# Patient Record
Sex: Male | Born: 1965 | Race: Black or African American | Hispanic: No | Marital: Married | Smoking: Never smoker
Health system: Southern US, Community
[De-identification: ages and names within clinical notes are randomized; demographics above are authoritative.]

## PROBLEM LIST (undated history)

## (undated) DIAGNOSIS — G4730 Sleep apnea, unspecified: Secondary | ICD-10-CM

## (undated) DIAGNOSIS — I10 Essential (primary) hypertension: Secondary | ICD-10-CM

## (undated) HISTORY — PX: NO PAST SURGERIES: SHX2092

---

## 2002-04-28 ENCOUNTER — Encounter: Payer: Self-pay | Admitting: Emergency Medicine

## 2002-04-28 ENCOUNTER — Emergency Department (HOSPITAL_COMMUNITY): Admission: EM | Admit: 2002-04-28 | Discharge: 2002-04-28 | Payer: Self-pay | Admitting: Emergency Medicine

## 2004-02-03 ENCOUNTER — Emergency Department (HOSPITAL_COMMUNITY): Admission: EM | Admit: 2004-02-03 | Discharge: 2004-02-03 | Payer: Self-pay | Admitting: Emergency Medicine

## 2006-04-16 ENCOUNTER — Encounter: Admission: RE | Admit: 2006-04-16 | Discharge: 2006-04-16 | Payer: Self-pay | Admitting: Internal Medicine

## 2008-08-08 ENCOUNTER — Encounter: Admission: RE | Admit: 2008-08-08 | Discharge: 2008-08-08 | Payer: Self-pay | Admitting: Urology

## 2009-01-19 ENCOUNTER — Encounter: Admission: RE | Admit: 2009-01-19 | Discharge: 2009-01-19 | Payer: Self-pay | Admitting: Orthopedic Surgery

## 2014-07-14 ENCOUNTER — Ambulatory Visit
Admission: RE | Admit: 2014-07-14 | Discharge: 2014-07-14 | Disposition: A | Discharge status: Home / Self Care | Payer: BC Managed Care – PPO | Source: Ambulatory Visit | Attending: Internal Medicine | Admitting: Internal Medicine

## 2014-07-14 ENCOUNTER — Other Ambulatory Visit: Payer: Self-pay | Admitting: Internal Medicine

## 2014-07-14 DIAGNOSIS — R1012 Left upper quadrant pain: Secondary | ICD-10-CM

## 2014-12-30 ENCOUNTER — Ambulatory Visit
Admission: RE | Admit: 2014-12-30 | Discharge: 2014-12-30 | Disposition: A | Discharge status: Home / Self Care | Payer: BC Managed Care – PPO | Source: Ambulatory Visit | Attending: Internal Medicine | Admitting: Internal Medicine

## 2014-12-30 ENCOUNTER — Other Ambulatory Visit: Payer: Self-pay | Admitting: Internal Medicine

## 2014-12-30 DIAGNOSIS — M545 Low back pain: Secondary | ICD-10-CM

## 2014-12-30 DIAGNOSIS — M546 Pain in thoracic spine: Secondary | ICD-10-CM

## 2015-11-03 ENCOUNTER — Encounter (HOSPITAL_COMMUNITY): Payer: Self-pay | Admitting: Family Medicine

## 2015-11-03 ENCOUNTER — Emergency Department (HOSPITAL_COMMUNITY)
Admission: EM | Admit: 2015-11-03 | Discharge: 2015-11-03 | Disposition: A | Discharge status: Home / Self Care | Payer: BC Managed Care – PPO | Attending: Emergency Medicine | Admitting: Emergency Medicine

## 2015-11-03 DIAGNOSIS — R51 Headache: Secondary | ICD-10-CM | POA: Diagnosis present

## 2015-11-03 DIAGNOSIS — R200 Anesthesia of skin: Secondary | ICD-10-CM | POA: Insufficient documentation

## 2015-11-03 DIAGNOSIS — R519 Headache, unspecified: Secondary | ICD-10-CM

## 2015-11-03 DIAGNOSIS — I10 Essential (primary) hypertension: Secondary | ICD-10-CM | POA: Insufficient documentation

## 2015-11-03 HISTORY — DX: Essential (primary) hypertension: I10

## 2015-11-03 MED ORDER — KETOROLAC TROMETHAMINE 60 MG/2ML IM SOLN
60.0000 mg | Freq: Once | INTRAMUSCULAR | Status: AC
  Administered 2015-11-03: 60 mg via INTRAMUSCULAR
  Filled 2015-11-03: qty 2

## 2015-11-03 NOTE — ED Notes (Signed)
Pt here for increased BP and headaches. sts some intermittent tingling in mouth. sts hard to swallow. sts he hasn't been taking meds everyday. sts he took lisinopril this am and Wednesday. sts took tylenol for headache.

## 2015-11-03 NOTE — Discharge Instructions (Signed)
Return to the ED with any concerns including chest pain, difficulty breathing, weakness of arms or legs, changes in vision or speech, fainting, decreased level of alertness/lethargy, or any other alarming symptoms °

## 2015-11-03 NOTE — ED Notes (Signed)
Pt states he started having bilateral jaw pain and left arm pain earlier today. Pt now c/o headache.

## 2015-11-03 NOTE — ED Provider Notes (Signed)
CSN: [id number]     Arrival date & time 11/03/15  1200 History   First MD Initiated Contact with Patient 11/03/15 1708     Chief Complaint  Patient presents with  . Hypertension     (Consider location/radiation/quality/duration/timing/severity/associated sxs/prior Treatment) HPI  Pt with hx of hypertension presenting with concern for elevated blood pressure as well as headache.  Pt states he does miss some BP med doses but over the past 2 days his pressure has been running higher than usual.  Also c/o frontal headache.  No changes in vision or speech.  No focal weakness.  Had some numbness around his mouth on both sides as well. No chest pain, no shortness of breath.  No difficulty breathing or leg swelling.  There are no other associated systemic symptoms, there are no other alleviating or modifying factors.   Past Medical History  Diagnosis Date  . Hypertension    History reviewed. No pertinent past surgical history. History reviewed. No pertinent family history. Social History  Substance Use Topics  . Smoking status: Never Smoker   . Smokeless tobacco: None  . Alcohol Use: No    Review of Systems  ROS reviewed and all otherwise negative except for mentioned in HPI    Allergies  Review of patient's allergies indicates no known allergies.  Home Medications   Prior to Admission medications   Not on File   BP 109/56 mmHg  Pulse 75  Temp(Src) 98.7 F (37.1 C) (Oral)  Resp 16  Ht 5\' 11"  (1.803 m)  Wt 130.636 kg  BMI 40.19 kg/m2  SpO2 99%  Vitals reviewed Physical Exam  Physical Examination: General appearance - alert, well appearing, and in no distress Mental status - alert, oriented to person, place, and time Eyes - pupils equal and reactive, extraocular eye movements intact Mouth - mucous membranes moist, pharynx normal without lesions Chest - clear to auscultation, no wheezes, rales or rhonchi, symmetric air entry Heart - normal rate, regular rhythm, normal S1,  S2, no murmurs, rubs, clicks or gallops Abdomen - soft, nontender, nondistended, no masses or organomegaly Neurological - alert, orientedx 3, cranial nerves 2-12 tested and intact, strength 5/5 in extremities x 4, sensation intact Extremities - peripheral pulses normal, no pedal edema, no clubbing or cyanosis Skin - normal coloration and turgor, no rashes  ED Course  Procedures (including critical care time) Labs Review Labs Reviewed - No data to display  Imaging Review No results found. I have personally reviewed and evaluated these images and lab results as part of my medical decision-making.   EKG Interpretation None       Date: 11/03/2015  Rate: 68  Rhythm: normal sinus rhythm  QRS Axis: normal  Intervals: normal  ST/T Wave abnormalities: early repolarization  Conduction Disutrbances: left anterior fascicular block  Narrative Interpretation: no prior ekg for comparison ekg link not available in epic for interpretation into muse     MDM   Final diagnoses:  Essential hypertension  Nonintractable episodic headache, unspecified headache type    Pt presenting with concern for hypertension as well as headache.  At the time of my evaluation, BP was normal.  Pt took his BP meds prior to arrival.  Pt has frontal headache- feels improved with rubbing his temples.  Normal neuro exam.  No signs of acute stroke, tumor.  No fever or neck pain to suggest meningitis.  Suspect tension type headache.  He feels some improvement after toradol.  Discharged with strict return precautions.  Pt agreeable with plan.    Jerelyn Scott, MD 11/03/15 [phone]

## 2015-11-03 NOTE — ED Notes (Signed)
Patient states symptoms started Wednesday night.

## 2018-07-07 ENCOUNTER — Other Ambulatory Visit: Payer: Self-pay | Admitting: Internal Medicine

## 2018-07-07 DIAGNOSIS — R1030 Lower abdominal pain, unspecified: Secondary | ICD-10-CM

## 2018-07-09 ENCOUNTER — Ambulatory Visit
Admission: RE | Admit: 2018-07-09 | Discharge: 2018-07-09 | Disposition: A | Discharge status: Home / Self Care | Payer: BC Managed Care – PPO | Source: Ambulatory Visit | Attending: Internal Medicine | Admitting: Internal Medicine

## 2018-07-09 ENCOUNTER — Other Ambulatory Visit: Payer: BC Managed Care – PPO

## 2018-07-09 DIAGNOSIS — R1030 Lower abdominal pain, unspecified: Secondary | ICD-10-CM

## 2018-07-09 MED ORDER — IOPAMIDOL (ISOVUE-300) INJECTION 61%
125.0000 mL | Freq: Once | INTRAVENOUS | Status: AC | PRN
  Administered 2018-07-09: 125 mL via INTRAVENOUS

## 2019-09-23 ENCOUNTER — Ambulatory Visit: Payer: BC Managed Care – PPO | Attending: Family

## 2019-09-23 DIAGNOSIS — Z23 Encounter for immunization: Secondary | ICD-10-CM

## 2019-09-23 NOTE — Progress Notes (Signed)
   Covid-19 Vaccination Clinic  Name:  Kevin [name]    MRN: [id number] DOB: [birth year]  09/23/2019  Kevin [name] was observed post Covid-19 immunization for 15 minutes without incident. He was provided with Vaccine Information Sheet and instruction to access the V-Safe system.   Kevin [name] was instructed to call 911 with any severe reactions post vaccine: Marland Kitchen Difficulty breathing  . Swelling of face and throat  . A fast heartbeat  . A bad rash all over body  . Dizziness and weakness   Immunizations Administered    Name Date Dose VIS Date Route   Moderna COVID-19 Vaccine 09/23/2019 11:03 AM 0.5 mL 06/01/2019 Intramuscular   Manufacturer: Moderna   Lot: [id number]   NDC: 81103-159-45

## 2019-10-26 ENCOUNTER — Ambulatory Visit: Payer: BC Managed Care – PPO | Attending: Family

## 2019-10-26 DIAGNOSIS — Z23 Encounter for immunization: Secondary | ICD-10-CM

## 2019-10-26 NOTE — Progress Notes (Signed)
   Covid-19 Vaccination Clinic  Name:  Kevin [name]    MRN: [id number] DOB: August 20, 1965  10/26/2019  Kevin [name] was observed post Covid-19 immunization for 15 minutes without incident. He was provided with Vaccine Information Sheet and instruction to access the V-Safe system.   Kevin [name] was instructed to call 911 with any severe reactions post vaccine: Marland Kitchen Difficulty breathing  . Swelling of face and throat  . A fast heartbeat  . A bad rash all over body  . Dizziness and weakness   Immunizations Administered    Name Date Dose VIS Date Route   Moderna COVID-19 Vaccine 10/26/2019 11:02 AM 0.5 mL 06/2019 Intramuscular   Manufacturer: Moderna   Lot: [id number]   NDC: 14643-142-76

## 2020-02-02 ENCOUNTER — Encounter (INDEPENDENT_AMBULATORY_CARE_PROVIDER_SITE_OTHER): Payer: Self-pay | Admitting: Otolaryngology

## 2020-02-02 ENCOUNTER — Ambulatory Visit (INDEPENDENT_AMBULATORY_CARE_PROVIDER_SITE_OTHER): Payer: BC Managed Care – PPO | Admitting: Otolaryngology

## 2020-02-02 ENCOUNTER — Other Ambulatory Visit: Payer: Self-pay

## 2020-02-02 VITALS — Temp 97.2°F

## 2020-02-02 DIAGNOSIS — J343 Hypertrophy of nasal turbinates: Secondary | ICD-10-CM

## 2020-02-02 DIAGNOSIS — J310 Chronic rhinitis: Secondary | ICD-10-CM | POA: Diagnosis not present

## 2020-02-02 NOTE — Progress Notes (Signed)
HPI: Kevin [name] is a 54 y.o. male who presents for evaluation of chronic nasal problems.  He is always had trouble breathing through his nose.  He used to use Synex and then Dristan but had some problems with these.  He tried the [employer] which did not seem to help.  Most recently was prescribed Nasacort and he seems to be doing better on the Nasacort.  He describes congestion and pressure in the nose at times.  He also states that his throat feels tight.  Past Medical History:  Diagnosis Date  . Hypertension    No past surgical history on file. Social History   Socioeconomic History  . Marital status: Married    Spouse name: Not on file  . Number of children: Not on file  . Years of education: Not on file  . Highest education level: Not on file  Occupational History  . Not on file  Tobacco Use  . Smoking status: Never Smoker  . Smokeless tobacco: Never Used  Substance and Sexual Activity  . Alcohol use: No  . Drug use: No  . Sexual activity: Not on file  Other Topics Concern  . Not on file  Social History Narrative  . Not on file   Social Determinants of Health   Financial Resource Strain:   . Difficulty of Paying Living Expenses:   Food Insecurity:   . Worried About Programme researcher, broadcasting/film/video in the Last Year:   . Barista in the Last Year:   Transportation Needs:   . Freight forwarder (Medical):   Marland Kitchen Lack of Transportation (Non-Medical):   Physical Activity:   . Days of Exercise per Week:   . Minutes of Exercise per Session:   Stress:   . Feeling of Stress :   Social Connections:   . Frequency of Communication with Friends and Family:   . Frequency of Social Gatherings with Friends and Family:   . Attends Religious Services:   . Active Member of Clubs or Organizations:   . Attends Banker Meetings:   Marland Kitchen Marital Status:    No family history on file. No Known Allergies Prior to Admission medications   Not on File     Positive ROS:  Otherwise negative  All other systems have been reviewed and were otherwise negative with the exception of those mentioned in the HPI and as above.  Physical Exam: Constitutional: Alert, well-appearing, no acute distress Ears: External ears without lesions or tenderness. Ear canals are clear bilaterally with intact, clear TMs.  Nasal: External nose without lesions. Septum mildly deviated to the left with moderate rhinitis.  He has a large swollen turbinates bilaterally.  After decongesting the nose he has no polyps or obstructing lesions noted otherwise.  Both millimeters regions are clear.. Oral: Lips and gums without lesions. Tongue and palate mucosa without lesions. Posterior oropharynx clear. Indirect laryngoscopy revealed a clear base of tongue vallecula epiglottis.  Vocal cords were clear.  Piriform sinuses were clear. Neck: No palpable adenopathy or masses Respiratory: Breathing comfortably  Skin: No facial/neck lesions or rash noted.  Procedures  Assessment: Chronic rhinitis Mild septal deviation with moderate turbinate hypertrophy  Plan: Would recommend regular use of Nasacort 2 sprays each nostril at night as well as intermittent saline irrigation as needed excessive mucus. If he does not get adequate relief with the use of the Nasacort could consider possible surgical intervention. But reviewed with him that his nasal passages are relatively clear  today.  Narda Bonds, MD

## 2020-05-16 ENCOUNTER — Other Ambulatory Visit: Payer: Self-pay | Admitting: Otolaryngology

## 2020-07-04 ENCOUNTER — Encounter (HOSPITAL_COMMUNITY): Payer: Self-pay | Admitting: Physician Assistant

## 2020-07-07 ENCOUNTER — Other Ambulatory Visit: Payer: Self-pay

## 2020-07-07 ENCOUNTER — Encounter (HOSPITAL_BASED_OUTPATIENT_CLINIC_OR_DEPARTMENT_OTHER): Payer: Self-pay | Admitting: Otolaryngology

## 2020-07-07 NOTE — Pre-Procedure Instructions (Signed)
Landan Fedie Vinston IV  07/07/2020     No Pharmacies Listed   Your procedure is scheduled on Jan. 12  Report to [facility] Entrance A at 6:30 A.M.  Call this number if you have problems the morning of surgery:  [phone]   Remember:  Do not eat or drink after midnight.     Take these medicines the morning of surgery with A SIP OF WATER : none              7 days prior to surgery STOP taking any Aspirin (unless otherwise instructed by your surgeon), Aleve, Naproxen, Ibuprofen, Motrin, Advil, Goody's, BC's, all herbal medications, fish oil, and all vitamins.             Follow your surgeon's instructions on when to stop Aspirin.  If no instructions were given by your surgeon then you will need to call the office to get those instructions.      Do not wear jewelry.  Do not wear lotions, powders, or perfumes, or deodorant.  Do not shave 48 hours prior to surgery.  Men may shave face and neck.  Do not bring valuables to the hospital.  [facility] is not responsible for any belongings or valuables.  Contacts, dentures or bridgework may not be worn into surgery.  Leave your suitcase in the car.  After surgery it may be brought to your room.  For patients admitted to the hospital, discharge time will be determined by your treatment team.  Patients discharged the day of surgery will not be allowed to drive home.   Special instructions:  [facility]- Preparing For Surgery  Before surgery, you can play an important role. Because skin is not sterile, your skin needs to be as free of germs as possible. You can reduce the number of germs on your skin by washing with CHG (chlorahexidine gluconate) Soap before surgery.  CHG is an antiseptic cleaner which kills germs and bonds with the skin to continue killing germs even after washing.    Oral Hygiene is also important to reduce your risk of infection.  Remember - BRUSH YOUR TEETH THE MORNING OF SURGERY WITH YOUR REGULAR  TOOTHPASTE  Please do not use if you have an allergy to CHG or antibacterial soaps. If your skin becomes reddened/irritated stop using the CHG.  Do not shave (including legs and underarms) for at least 48 hours prior to first CHG shower. It is OK to shave your face.  Please follow these instructions carefully.   1. Shower the NIGHT BEFORE SURGERY and the MORNING OF SURGERY with CHG.   2. If you chose to wash your hair, wash your hair first as usual with your normal shampoo.  3. After you shampoo, rinse your hair and body thoroughly to remove the shampoo.  4. Use CHG as you would any other liquid soap. You can apply CHG directly to the skin and wash gently with a scrungie or a clean washcloth.   5. Apply the CHG Soap to your body ONLY FROM THE NECK DOWN.  Do not use on open wounds or open sores. Avoid contact with your eyes, ears, mouth and genitals (private parts). Wash Face and genitals (private parts)  with your normal soap.  6. Wash thoroughly, paying special attention to the area where your surgery will be performed.  7. Thoroughly rinse your body with warm water from the neck down.  8. DO NOT shower/wash with your normal soap after using and  rinsing off the CHG Soap.  9. Pat yourself dry with a CLEAN TOWEL.  10. Wear CLEAN PAJAMAS to bed the night before surgery, wear comfortable clothes the morning of surgery  11. Place CLEAN SHEETS on your bed the night of your first shower and DO NOT SLEEP WITH PETS.    Day of Surgery:  Do not apply any deodorants/lotions.  Please wear clean clothes to the hospital/surgery center.   Remember to brush your teeth WITH YOUR REGULAR TOOTHPASTE.    Please read over the following fact sheets that you were given.

## 2020-07-10 ENCOUNTER — Other Ambulatory Visit: Payer: Self-pay

## 2020-07-10 ENCOUNTER — Encounter (HOSPITAL_COMMUNITY)
Admission: RE | Admit: 2020-07-10 | Discharge: 2020-07-10 | Disposition: A | Discharge status: Home / Self Care | Payer: BC Managed Care – PPO | Source: Ambulatory Visit | Attending: Otolaryngology | Admitting: Otolaryngology

## 2020-07-11 ENCOUNTER — Other Ambulatory Visit (HOSPITAL_COMMUNITY): Payer: BC Managed Care – PPO

## 2020-07-11 NOTE — Progress Notes (Signed)
Called and spoke with pt about going for COVID test today for surgery tomorrow 07/12/20. Pt stated his surgery will need to be on hold as he will not be able to pay the amount his insurance has projected for surgery. He stated he has called and LVM at Dr. Clovis Pu office. Left message with Caryn Bee at Dr. Thurmon Fair office.

## 2020-07-12 ENCOUNTER — Ambulatory Visit (HOSPITAL_BASED_OUTPATIENT_CLINIC_OR_DEPARTMENT_OTHER): Admission: RE | Admit: 2020-07-12 | Payer: BC Managed Care – PPO | Source: Home / Self Care | Admitting: Otolaryngology

## 2020-07-12 HISTORY — DX: Essential (primary) hypertension: I10

## 2020-07-12 SURGERY — SEPTOPLASTY, NOSE, WITH NASAL TURBINATE REDUCTION
Anesthesia: General | Laterality: Bilateral

## 2020-11-10 ENCOUNTER — Ambulatory Visit
Admission: RE | Admit: 2020-11-10 | Discharge: 2020-11-10 | Disposition: A | Discharge status: Home / Self Care | Payer: BC Managed Care – PPO | Source: Ambulatory Visit | Attending: Internal Medicine | Admitting: Internal Medicine

## 2020-11-10 ENCOUNTER — Other Ambulatory Visit: Payer: Self-pay | Admitting: Internal Medicine

## 2020-11-10 DIAGNOSIS — M25562 Pain in left knee: Secondary | ICD-10-CM

## 2021-01-30 ENCOUNTER — Ambulatory Visit (HOSPITAL_COMMUNITY)
Admission: EM | Admit: 2021-01-30 | Discharge: 2021-01-30 | Disposition: A | Discharge status: Home / Self Care | Payer: BC Managed Care – PPO

## 2021-01-30 ENCOUNTER — Other Ambulatory Visit: Payer: Self-pay

## 2021-01-30 ENCOUNTER — Encounter (HOSPITAL_COMMUNITY): Payer: Self-pay | Admitting: Emergency Medicine

## 2021-01-30 DIAGNOSIS — J302 Other seasonal allergic rhinitis: Secondary | ICD-10-CM | POA: Diagnosis not present

## 2021-01-30 DIAGNOSIS — J342 Deviated nasal septum: Secondary | ICD-10-CM

## 2021-01-30 NOTE — Discharge Instructions (Addendum)
Continue to use the nasal spray as previously prescribed. Take a daily allergy medication, such as Allegra-D.    You can take Tylenol and/or Ibuprofen as needed for pain and fevers.    Follow up with your primary care provider tomorrow as scheduled.  You may need referral to ENT for further evaluation and management.

## 2021-01-30 NOTE — ED Provider Notes (Signed)
[facility]    CSN: [id number] Arrival date & time: 01/30/21  1556      History   Chief Complaint Chief Complaint  Patient presents with   Nasal Congestion   Shortness of Breath   chest heaviness    HPI Kevin [name] is a 55 y.o. male.   Patient here for evaluation of shortness of breath, chest tightness, and nasal congestion.  Reports diagnosed with a deviated septum previously.  States that he will develop symptoms intermittently for the past year but that they typically resolve on their own.  Reports appointment with PCP tomorrow.  States that earlier today developed congestion and chest tightness.  At this time symptoms have resolved.  Has not taken any OTC medications or treatments.  Denies any trauma, injury, or other precipitating event.  Denies any specific alleviating or aggravating factors.  Denies any fevers, N/V/D, numbness, tingling, weakness, abdominal pain, or headaches.     The history is provided by the patient.  Shortness of Breath Associated symptoms: no chest pain, no cough and no wheezing    Past Medical History:  Diagnosis Date   Hypertension     There are no problems to display for this patient.   History reviewed. No pertinent surgical history.     Home Medications    Prior to Admission medications   Medication Sig Start Date End Date Taking? Authorizing Provider  lisinopril (ZESTRIL) 10 MG tablet Take 10 mg by mouth daily.    [provider]  Omega-3 Fatty Acids (FISH OIL) 1000 MG CAPS 2 capsule    [provider]  tamsulosin (FLOMAX) 0.4 MG CAPS capsule 1 capsule    [provider]  triamcinolone (NASACORT ALLERGY 24HR) 55 MCG/ACT AERO nasal inhaler 2 spray in each nostril    [provider]    Family History History reviewed. No pertinent family history.  Social History Social History   Tobacco Use   Smoking status: Never   Smokeless tobacco: Never  Vaping Use   Vaping Use: Never  used  Substance Use Topics   Alcohol use: Not Currently   Drug use: Never     Allergies   Patient has no known allergies.   Review of Systems Review of Systems  Respiratory:  Positive for chest tightness and shortness of breath. Negative for cough, choking, wheezing and stridor.   Cardiovascular:  Negative for chest pain, palpitations and leg swelling.  All other systems reviewed and are negative.   Physical Exam Triage Vital Signs ED Triage Vitals  Enc Vitals Group     BP 01/30/21 1651 (!) 156/92     Pulse Rate 01/30/21 1651 71     Resp 01/30/21 1651 18     Temp 01/30/21 1651 98.8 F (37.1 C)     Temp src --      SpO2 01/30/21 1651 97 %     Weight --      Height --      Head Circumference --      Peak Flow --      Pain Score 01/30/21 1649 0     Pain Loc --      Pain Edu? --      Excl. in GC? --    No data found.  Updated Vital Signs BP (!) 156/92   Pulse 71   Temp 98.8 F (37.1 C)   Resp 18   SpO2 97%   Visual Acuity Right Eye Distance:   Left  Eye Distance:   Bilateral Distance:    Right Eye Near:   Left Eye Near:    Bilateral Near:     Physical Exam Vitals and nursing note reviewed.  Constitutional:      General: He is not in acute distress.    Appearance: Normal appearance. He is not ill-appearing, toxic-appearing or diaphoretic.  HENT:     Head: Normocephalic and atraumatic.     Nose: Septal deviation (right side partially occluded) present. No nasal tenderness, mucosal edema, congestion or rhinorrhea.     Right Turbinates: Swollen.     Right Sinus: No maxillary sinus tenderness or frontal sinus tenderness.     Left Sinus: No maxillary sinus tenderness or frontal sinus tenderness.  Eyes:     Conjunctiva/sclera: Conjunctivae normal.  Cardiovascular:     Rate and Rhythm: Normal rate.     Pulses: Normal pulses.     Heart sounds: Normal heart sounds.  Pulmonary:     Effort: Pulmonary effort is normal.     Breath sounds: Normal breath sounds.  No decreased breath sounds, wheezing, rhonchi or rales.  Abdominal:     General: Abdomen is flat.  Musculoskeletal:        General: Normal range of motion.     Cervical back: Normal range of motion.  Skin:    General: Skin is warm and dry.  Neurological:     General: No focal deficit present.     Mental Status: He is alert and oriented to person, place, and time.  Psychiatric:        Mood and Affect: Mood normal.     UC Treatments / Results  Labs (all labs ordered are listed, but only abnormal results are displayed) Labs Reviewed - No data to display  EKG   Radiology No results found.  Procedures Procedures (including critical care time)  Medications Ordered in UC Medications - No data to display  Initial Impression / Assessment and Plan / UC Course  I have reviewed the triage vital signs and the nursing notes.  Pertinent labs & imaging results that were available during my care of the patient were reviewed by me and considered in my medical decision making (see chart for details).    Assessment negative for red flags or concerns.  Likely deviated septum and seasonal allergies.  Recommend continue nasal spray as previously prescribed.  Recommend starting daily allergy medication, such as Allegra-D.  Follow up with PCP tomorrow as scheduled.  May need referral to ENT for further evaluation.  Final Clinical Impressions(s) / UC Diagnoses   Final diagnoses:  Deviated septum  Seasonal allergies     Discharge Instructions      Continue to use the nasal spray as previously prescribed. Take a daily allergy medication, such as Allegra-D.    You can take Tylenol and/or Ibuprofen as needed for pain and fevers.    Follow up with your primary care provider tomorrow as scheduled.  You may need referral to ENT for further evaluation and management.       ED Prescriptions   None    PDMP not reviewed this encounter.   Ivette Loyal, NP 01/30/21 1734

## 2021-01-30 NOTE — ED Triage Notes (Signed)
Pt is present today with chest heaviness, SOB, and nasal congestion. Pt states that his sx started today. Pt states that his sx are sporadic and started one year and recently became bothersome today

## 2021-01-31 ENCOUNTER — Other Ambulatory Visit: Payer: Self-pay | Admitting: Internal Medicine

## 2021-01-31 ENCOUNTER — Ambulatory Visit
Admission: RE | Admit: 2021-01-31 | Discharge: 2021-01-31 | Disposition: A | Discharge status: Home / Self Care | Payer: BC Managed Care – PPO | Source: Ambulatory Visit | Attending: Internal Medicine | Admitting: Internal Medicine

## 2021-01-31 DIAGNOSIS — R0789 Other chest pain: Secondary | ICD-10-CM

## 2021-02-18 ENCOUNTER — Encounter (HOSPITAL_COMMUNITY): Payer: Self-pay | Admitting: *Deleted

## 2021-02-18 ENCOUNTER — Other Ambulatory Visit: Payer: Self-pay

## 2021-02-18 ENCOUNTER — Emergency Department (HOSPITAL_COMMUNITY)
Admission: EM | Admit: 2021-02-18 | Discharge: 2021-02-19 | Disposition: A | Discharge status: Home / Self Care | Payer: BC Managed Care – PPO | Attending: Emergency Medicine | Admitting: Emergency Medicine

## 2021-02-18 ENCOUNTER — Emergency Department (HOSPITAL_COMMUNITY): Payer: BC Managed Care – PPO

## 2021-02-18 DIAGNOSIS — I10 Essential (primary) hypertension: Secondary | ICD-10-CM | POA: Insufficient documentation

## 2021-02-18 DIAGNOSIS — Z20822 Contact with and (suspected) exposure to covid-19: Secondary | ICD-10-CM | POA: Insufficient documentation

## 2021-02-18 DIAGNOSIS — K2100 Gastro-esophageal reflux disease with esophagitis, without bleeding: Secondary | ICD-10-CM

## 2021-02-18 DIAGNOSIS — R079 Chest pain, unspecified: Secondary | ICD-10-CM | POA: Diagnosis present

## 2021-02-18 DIAGNOSIS — Z79899 Other long term (current) drug therapy: Secondary | ICD-10-CM | POA: Insufficient documentation

## 2021-02-18 LAB — CBC
HCT: 41.4 % (ref 39.0–52.0)
Hemoglobin: 14.5 g/dL (ref 13.0–17.0)
MCH: 30.2 pg (ref 26.0–34.0)
MCHC: 35 g/dL (ref 30.0–36.0)
MCV: 86.3 fL (ref 80.0–100.0)
Platelets: 302 10*3/uL (ref 150–400)
RBC: 4.8 MIL/uL (ref 4.22–5.81)
RDW: 13.4 % (ref 11.5–15.5)
WBC: 8.4 10*3/uL (ref 4.0–10.5)
nRBC: 0 % (ref 0.0–0.2)

## 2021-02-18 LAB — BASIC METABOLIC PANEL
Anion gap: 9 (ref 5–15)
BUN: 12 mg/dL (ref 6–20)
CO2: 23 mmol/L (ref 22–32)
Calcium: 9.6 mg/dL (ref 8.9–10.3)
Chloride: 105 mmol/L (ref 98–111)
Creatinine, Ser: 0.91 mg/dL (ref 0.61–1.24)
GFR, Estimated: >60 mL/min (ref >60)
Glucose, Bld: 107 mg/dL — ABNORMAL HIGH (ref 70–99)
Potassium: 3.1 mmol/L — ABNORMAL LOW (ref 3.5–5.1)
Sodium: 137 mmol/L (ref 135–145)

## 2021-02-18 LAB — RESP PANEL BY RT-PCR (FLU A&B, COVID) ARPGX2
Influenza A by PCR: NEGATIVE
Influenza B by PCR: NEGATIVE
SARS Coronavirus 2 by RT PCR: NEGATIVE

## 2021-02-18 LAB — TROPONIN I (HIGH SENSITIVITY): Troponin I (High Sensitivity): 7 ng/L (ref <18)

## 2021-02-18 MED ORDER — OMEPRAZOLE 20 MG PO CPDR: 20.0000 mg | DELAYED_RELEASE_CAPSULE | Freq: Every day | ORAL | 0 refills | Status: DC

## 2021-02-18 MED ORDER — ALUM & MAG HYDROXIDE-SIMETH 200-200-20 MG/5ML PO SUSP
30.0000 mL | Freq: Once | ORAL | Status: AC
  Administered 2021-02-18: 30 mL via ORAL
  Filled 2021-02-18: qty 30

## 2021-02-18 MED ORDER — SUCRALFATE 1 GM/10ML PO SUSP: 1.0000 g | Freq: Three times a day (TID) | ORAL | 0 refills | Status: DC

## 2021-02-18 MED ORDER — POTASSIUM CHLORIDE CRYS ER 20 MEQ PO TBCR
40.0000 meq | EXTENDED_RELEASE_TABLET | Freq: Once | ORAL | Status: AC
  Administered 2021-02-18: 40 meq via ORAL
  Filled 2021-02-18: qty 2

## 2021-02-18 MED ORDER — LIDOCAINE VISCOUS HCL 2 % MT SOLN
15.0000 mL | Freq: Once | OROMUCOSAL | Status: AC
  Administered 2021-02-18: 15 mL via ORAL
  Filled 2021-02-18: qty 15

## 2021-02-18 NOTE — Discharge Instructions (Addendum)
Please follow-up with your PCP.  Please take Carafate with meals and at bedtime.  Please take Prilosec daily.

## 2021-02-18 NOTE — ED Provider Notes (Signed)
[facility] EMERGENCY DEPARTMENT Provider Note   CSN: [id number] Arrival date & time: 02/18/21  2032     History Chief Complaint  Patient presents with   Chest Pain   Shortness of Breath    Kevin [name] is a 55 y.o. male.  Patient is a 55 year old male with a past medical history of hypertension that is presenting for chest pain and shortness of breath.  He describes his chest pain as a burning sensation.  He denies any radiation of the chest pain.  He denies any nausea, vomiting, or diaphoresis.  He states that he has a sensation of a lump in his throat. He was evaluated yesterday at an urgent care in which he received Maalox and viscous lidocaine with relief of symptoms.  He states that he has been experiencing excessive belching.  Patient states that he was prescribed Protonix and has not taken it today.  He states that the burning chest pain is worse when lying down.  He denies food causing exacerbation of his symptoms.  He states that he puts hot sauce on the majority of his foods.  Patient endorses smoking cigars occasionally.  Chest Pain Associated symptoms: shortness of breath   Associated symptoms: no abdominal pain, no cough, no diaphoresis, no dizziness, no dysphagia, no fatigue, no fever, no headache, no nausea, no numbness, no palpitations and no vomiting   Shortness of Breath Associated symptoms: chest pain   Associated symptoms: no abdominal pain, no cough, no diaphoresis, no ear pain, no fever, no headaches, no neck pain, no rash, no sore throat, no vomiting and no wheezing       Past Medical History:  Diagnosis Date   Hypertension     There are no problems to display for this patient.   History reviewed. No pertinent surgical history.     No family history on file.  Social History   Tobacco Use   Smoking status: Never   Smokeless tobacco: Never  Vaping Use   Vaping Use: Never used  Substance Use Topics   Alcohol use: Not  Currently   Drug use: Never    Home Medications Prior to Admission medications   Medication Sig Start Date End Date Taking? Authorizing Provider  Artificial Tear Ointment (DRY EYES OP) Place 1 drop into both eyes daily as needed (dry eyes).   Yes [provider]  fluticasone (FLONASE) 50 MCG/ACT nasal spray Place 2 sprays into both nostrils daily as needed for allergies or rhinitis.   Yes [provider]  lisinopril-hydrochlorothiazide (ZESTORETIC) 10-12.5 MG tablet Take 1 tablet by mouth daily. 11/04/20  Yes [provider]  omeprazole (PRILOSEC) 20 MG capsule Take 1 capsule (20 mg total) by mouth daily. 02/18/21 03/20/21 Yes Lottie Dawson, MD  Phenylephrine HCl (SINEX ULTRA FINE MIST REGULAR NA) Place 2 sprays into both nostrils 3 (three) times daily as needed (congestion).   Yes [provider]  sucralfate (CARAFATE) 1 GM/10ML suspension Take 10 mLs (1 g total) by mouth 4 (four) times daily -  with meals and at bedtime. 02/18/21  Yes Lottie Dawson, MD  tamsulosin (FLOMAX) 0.4 MG CAPS capsule Take 0.4 mg by mouth daily.   Yes [provider]    Allergies    Patient has no known allergies.  Review of Systems   Review of Systems  Constitutional:  Negative for chills, diaphoresis, fatigue and fever.  HENT:  Negative for congestion, dental problem, ear pain, facial swelling, hearing loss, nosebleeds, postnasal  drip, rhinorrhea, sore throat and trouble swallowing.   Eyes:  Negative for photophobia, pain and visual disturbance.  Respiratory:  Positive for shortness of breath. Negative for apnea, cough, choking, chest tightness, wheezing and stridor.   Cardiovascular:  Positive for chest pain. Negative for palpitations and leg swelling.  Gastrointestinal:  Negative for abdominal distention, abdominal pain, constipation, diarrhea, nausea and vomiting.       Reflux  Endocrine: Negative for polydipsia and polyuria.  Genitourinary:  Negative for difficulty  urinating, dysuria, flank pain, frequency, hematuria and urgency.  Musculoskeletal:  Negative for gait problem, myalgias, neck pain and neck stiffness.  Skin:  Negative for rash and wound.  Allergic/Immunologic: Negative for environmental allergies and food allergies.  Neurological:  Negative for dizziness, tremors, seizures, syncope, facial asymmetry, speech difficulty, light-headedness, numbness and headaches.  Psychiatric/Behavioral:  Negative for behavioral problems and confusion.   All other systems reviewed and are negative.  Physical Exam Updated Vital Signs BP 132/82   Pulse (!) 59   Temp 98.2 F (36.8 C) (Oral)   Resp 10   Ht 5\' 11"  (1.803 m)   Wt 105.2 kg   SpO2 99%   BMI 32.36 kg/m   Physical Exam Vitals and nursing note reviewed.  Constitutional:      General: He is not in acute distress.    Appearance: Normal appearance. He is normal weight.  HENT:     Head: Normocephalic and atraumatic.     Right Ear: External ear normal.     Left Ear: External ear normal.     Nose: Nose normal. No congestion.     Mouth/Throat:     Mouth: Mucous membranes are moist.     Pharynx: Oropharynx is clear. No oropharyngeal exudate or posterior oropharyngeal erythema.  Eyes:     General: No visual field deficit.    Extraocular Movements: Extraocular movements intact.     Conjunctiva/sclera: Conjunctivae normal.     Pupils: Pupils are equal, round, and reactive to light.  Cardiovascular:     Rate and Rhythm: Normal rate and regular rhythm.     Pulses: Normal pulses.     Heart sounds: Normal heart sounds. No murmur heard.   No friction rub. No gallop.  Pulmonary:     Effort: Pulmonary effort is normal. No respiratory distress.     Breath sounds: Normal breath sounds. No stridor. No wheezing, rhonchi or rales.  Chest:     Chest wall: No tenderness.  Abdominal:     General: Abdomen is flat. Bowel sounds are normal. There is no distension.     Palpations: Abdomen is soft.      Tenderness: There is no abdominal tenderness. There is no right CVA tenderness, left CVA tenderness, guarding or rebound.  Musculoskeletal:        General: No swelling or tenderness. Normal range of motion.     Cervical back: Normal range of motion and neck supple. No rigidity, tenderness or bony tenderness.     Thoracic back: Normal. No tenderness or bony tenderness.     Lumbar back: Normal. No tenderness or bony tenderness.     Right lower leg: No edema.     Left lower leg: No edema.  Skin:    General: Skin is warm and dry.  Neurological:     General: No focal deficit present.     Mental Status: He is alert and oriented to person, place, and time. Mental status is at baseline.     Cranial Nerves: Cranial  nerves are intact. No cranial nerve deficit, dysarthria or facial asymmetry.     Sensory: Sensation is intact. No sensory deficit.     Motor: Motor function is intact. No weakness.     Coordination: Coordination is intact. Finger-Nose-Finger Test normal.     Gait: Gait is intact. Gait normal.  Psychiatric:        Mood and Affect: Mood normal.        Behavior: Behavior normal.        Thought Content: Thought content normal.        Judgment: Judgment normal.    ED Results / Procedures / Treatments   Labs (all labs ordered are listed, but only abnormal results are displayed) Labs Reviewed  BASIC METABOLIC PANEL - Abnormal; Notable for the following components:      Result Value   Potassium 3.1 (*)    Glucose, Bld 107 (*)    All other components within normal limits  RESP PANEL BY RT-PCR (FLU A&B, COVID) ARPGX2  CBC  TROPONIN I (HIGH SENSITIVITY)  TROPONIN I (HIGH SENSITIVITY)    EKG EKG Interpretation  Date/Time:  Sunday February 18 2021 20:48:49 EDT Ventricular Rate:  78 PR Interval:  158 QRS Duration: 98 QT Interval:  398 QTC Calculation: 453 R Axis:   -46 Text Interpretation: Normal sinus rhythm Left anterior fascicular block Moderate voltage criteria for LVH, may be  normal variant ( R in aVL , Cornell product ) Abnormal ECG Nonspecific ST abnormality No significant change since last tracing Confirmed by Derwood KaplanNanavati, Ankit [phone]([id number]) on 02/18/2021 10:33:01 PM  Radiology DG Chest 2 View  Result Date: 02/18/2021 CLINICAL DATA:  One month history of mid chest tightness. Tingling in the jaws. Weight loss. EXAM: CHEST - 2 VIEW COMPARISON:  02/16/2021 FINDINGS: The heart size and mediastinal contours are within normal limits. Both lungs are clear. The visualized skeletal structures are unremarkable. IMPRESSION: No active cardiopulmonary disease. Electronically Signed   By: Burman NievesWilliam  Stevens M.D.   On: 02/18/2021 21:25    Procedures Procedures   Medications Ordered in ED Medications  alum & mag hydroxide-simeth (MAALOX/MYLANTA) 200-200-20 MG/5ML suspension 30 mL (30 mLs Oral Given 02/18/21 2322)    And  lidocaine (XYLOCAINE) 2 % viscous mouth solution 15 mL (15 mLs Oral Given 02/18/21 2322)  potassium chloride SA (KLOR-CON) CR tablet 40 mEq (40 mEq Oral Given 02/18/21 2323)    ED Course  I have reviewed the triage vital signs and the nursing notes.  Pertinent labs & imaging results that were available during my care of the patient were reviewed by me and considered in my medical decision making (see chart for details).    MDM Rules/Calculators/A&P                         Kevin [name] is a 55 y.o. male with a past medical history of hypertension is presenting for chest pain and shortness of breath. Patient is hemodynamically stable and in no acute distress. He describes his chest pain as a burning sensation. He has had increased belching. He has a sensation of a lump in his throat. He endorses eating spicy food with every meal and history of cigar use. He was evaluated at urgent care with relief of symptom after maalox and viscous lidocaine.   His EKG showed no acute signs of ischemia. Initial troponin was negative. CXR showed no acute cardiac or pulmonary  abnormality. COVID test was negative. CBC was unremarkable.  BMP showed hypokalemia and he was given oral potassium in ED. He was given maalox and viscous lidocaine with relief of symptoms. His chest pain is most likely secondary to reflux. He will follow up with his PCP for reflux. He also endorses weight loss. I told him to discuss this with his PCP. He was prescribed carafate and prilosec. He will continue to take protonix.   At time of signout, second troponin pending.   Patient signed out to Dr. Pilar Plate at 2330. Please see his note for the rest of the patient's ED visit.    Patient states compliance and understanding of the plan. I explained labs and imaging to the patient. No further questions at this time from the patient.  The patient is safe and stable for discharge at this time with return precautions provided and a plan for follow-up care in place as needed  The plan for this patient was discussed with Dr. Rhunette Croft, who voiced agreement and who oversaw evaluation and treatment of this patient.   Final Clinical Impression(s) / ED Diagnoses Final diagnoses:  Gastroesophageal reflux disease with esophagitis without hemorrhage    Rx / DC Orders ED Discharge Orders          Ordered    sucralfate (CARAFATE) 1 GM/10ML suspension  3 times daily with meals & bedtime        02/18/21 2324    omeprazole (PRILOSEC) 20 MG capsule  Daily        02/18/21 2324             Lottie Dawson, MD 02/19/21 1256    Derwood Kaplan, MD 02/19/21 2010

## 2021-02-18 NOTE — ED Triage Notes (Addendum)
Pt states sob, dizziness, lump in throat and chest pain for several days.  Was seen at [facility] Friday, but s/s are not improving. VS stable.

## 2021-02-18 NOTE — ED Provider Notes (Signed)
  Provider Note MRN:  [id number]  Arrival date & time: 02/19/21    ED Course and Medical Decision Making  Assumed care from Dr. Rhunette Croft at shift change.  Chest pain, favored GI etiology, awaiting second troponin, anticipating discharge.  Procedures  Final Clinical Impressions(s) / ED Diagnoses     ICD-10-CM   1. Gastroesophageal reflux disease with esophagitis without hemorrhage  K21.00       ED Discharge Orders          Ordered    sucralfate (CARAFATE) 1 GM/10ML suspension  3 times daily with meals & bedtime        02/18/21 2324    omeprazole (PRILOSEC) 20 MG capsule  Daily        02/18/21 2324              Discharge Instructions      Please follow-up with your PCP.  Please take Carafate with meals and at bedtime.  Please take Prilosec daily.    Elmer Sow. Pilar Plate, MD [facility] Emergency Medicine [facility] [email or web address]    Sabas Sous, MD 02/19/21 Burna Mortimer

## 2021-02-19 LAB — TROPONIN I (HIGH SENSITIVITY): Troponin I (High Sensitivity): 7 ng/L (ref <18)

## 2021-02-22 ENCOUNTER — Ambulatory Visit
Admission: RE | Admit: 2021-02-22 | Discharge: 2021-02-22 | Disposition: A | Discharge status: Home / Self Care | Payer: BC Managed Care – PPO | Source: Ambulatory Visit | Attending: Internal Medicine | Admitting: Internal Medicine

## 2021-02-22 ENCOUNTER — Other Ambulatory Visit: Payer: Self-pay | Admitting: Internal Medicine

## 2021-02-22 DIAGNOSIS — R079 Chest pain, unspecified: Secondary | ICD-10-CM

## 2021-02-22 DIAGNOSIS — R0602 Shortness of breath: Secondary | ICD-10-CM

## 2021-02-22 MED ORDER — IOPAMIDOL (ISOVUE-370) INJECTION 76%
75.0000 mL | Freq: Once | INTRAVENOUS | Status: AC | PRN
  Administered 2021-02-22: 75 mL via INTRAVENOUS

## 2021-02-27 ENCOUNTER — Other Ambulatory Visit: Payer: Self-pay | Admitting: Internal Medicine

## 2021-02-27 ENCOUNTER — Ambulatory Visit
Admission: RE | Admit: 2021-02-27 | Discharge: 2021-02-27 | Disposition: A | Discharge status: Home / Self Care | Payer: BC Managed Care – PPO | Source: Ambulatory Visit | Attending: Internal Medicine | Admitting: Internal Medicine

## 2021-02-27 DIAGNOSIS — R109 Unspecified abdominal pain: Secondary | ICD-10-CM

## 2021-02-27 DIAGNOSIS — M549 Dorsalgia, unspecified: Secondary | ICD-10-CM

## 2021-02-27 DIAGNOSIS — R3129 Other microscopic hematuria: Secondary | ICD-10-CM

## 2021-02-27 MED ORDER — IOPAMIDOL (ISOVUE-300) INJECTION 61%
100.0000 mL | Freq: Once | INTRAVENOUS | Status: AC | PRN
  Administered 2021-02-27: 100 mL via INTRAVENOUS

## 2021-04-10 ENCOUNTER — Encounter: Payer: Self-pay | Admitting: Allergy & Immunology

## 2021-04-10 ENCOUNTER — Ambulatory Visit: Payer: BC Managed Care – PPO | Admitting: Allergy & Immunology

## 2021-04-10 ENCOUNTER — Other Ambulatory Visit: Payer: Self-pay

## 2021-04-10 VITALS — BP 124/80 | HR 68 | Temp 97.8°F | Resp 18 | Ht 71.0 in | Wt 233.8 lb

## 2021-04-10 DIAGNOSIS — H1013 Acute atopic conjunctivitis, bilateral: Secondary | ICD-10-CM

## 2021-04-10 DIAGNOSIS — J302 Other seasonal allergic rhinitis: Secondary | ICD-10-CM

## 2021-04-10 DIAGNOSIS — H1010 Acute atopic conjunctivitis, unspecified eye: Secondary | ICD-10-CM | POA: Insufficient documentation

## 2021-04-10 DIAGNOSIS — J3089 Other allergic rhinitis: Secondary | ICD-10-CM

## 2021-04-10 MED ORDER — MONTELUKAST SODIUM 10 MG PO TABS: 10.0000 mg | ORAL_TABLET | Freq: Every day | ORAL | 5 refills | Status: DC

## 2021-04-10 MED ORDER — XHANCE 93 MCG/ACT NA EXHU: INHALANT_SUSPENSION | NASAL | 5 refills | Status: DC

## 2021-04-10 NOTE — Progress Notes (Signed)
NEW PATIENT  Date of Service/Encounter:  04/10/21  Consult requested by: Kevin Housekeeper, MD   Assessment:   Allergic rhinoconjunctivitis, seasonal and perennial (grasses, ragweed, trees, indoor molds, outdoor molds, dust mites, and cockroach)  Deviated septum - following by Dr. Annalee [name]  Plan/Recommendations:   1. Allergic rhinoconjunctivitis, seasonal and perennial - Testing today showed: grasses, ragweed, trees, indoor molds, outdoor molds, dust mites, and cockroach - Copy of test results provided.  - Avoidance measures provided. - Stop taking: all of your current medications - Start taking: Singulair (montelukast) 10mg  daily and Xhance (fluticasone) 1-2 sprays per nostril daily - Singulair can cause depression/irritability, so beware of that and stop if this happens.  - You can use an antihistamine for breakthrough symptoms (Allegra, Zyrtec, Xyzal, etc).  - Consider nasal saline rinses 1-2 times daily to remove allergens from the nasal cavities as well as help with mucous clearance (this is especially helpful to do before the nasal sprays are given) - Consider allergy shots as a means of long-term control. - Allergy shots "re-train" and "reset" the immune system to ignore environmental allergens and decrease the resulting immune response to those allergens (sneezing, itchy watery eyes, runny nose, nasal congestion, etc).    - Allergy shots improve symptoms in 75-85% of patients.  - We can discuss more at the next appointment if the medications are not working for you.  2. Return in about 4 weeks (around 05/08/2021).    This note in its entirety was forwarded to the Provider who requested this consultation.  Subjective:   Kevin [name] is a 55 y.o. male presenting today for evaluation of  Chief Complaint  Patient presents with   Allergic Rhinitis     Constant stuffy nose - he was told he has a deviated septum on the left side, but the congestion switches sides.  Makes it hard to sleep.     [name] has a history of the following: Patient Active Problem List   Diagnosis Date Noted   Allergic rhinoconjunctivitis, seasonal and perennial 04/10/2021     History obtained from: chart review and patient.  06/10/2021 [name] was referred by Kevin Manges, MD.     Kevin [name] is a 55 y.o. male presenting for an evaluation of environmental allergies .  Allergic Rhinitis Symptom History: He has a history of congestion during certain times of the year. He does fine at the beach. He just clogs up mostly during the end of summer and into the fall months. Going into the fall season is worse. He does have a deviated septum towards the right side. But the congestion alternates. He has seen ENT in the past on [address]  He sees Dr. [address] He is actually scheduled for surgery this next week.   He has tried a number of nose sprays. He has used Sinex as well as Mucinex which seems to work the best (this is the nose spray). He has fluticasone which seems to be used the most consistently.  Otherwise, there is no history of other atopic diseases, including asthma, food allergies, drug allergies, stinging insect allergies, eczema, urticaria, or contact dermatitis. There is no significant infectious history. Vaccinations are up to date.    Past Medical History: Patient Active Problem List   Diagnosis Date Noted   Allergic rhinoconjunctivitis, seasonal and perennial 04/10/2021     Medication List:  Allergies as of 04/10/2021   No Known Allergies      Medication List  Accurate as of April 10, 2021 12:12 PM. If you have any questions, ask your nurse or doctor.          DRY EYES OP Place 1 drop into both eyes daily as needed (dry eyes).   fluticasone 50 MCG/ACT nasal spray Commonly known as: FLONASE Place 2 sprays into both nostrils daily as needed for allergies or rhinitis. What changed: Another medication with the same name was  added. Make sure you understand how and when to take each. Changed by: Alfonse Spruce, MD   Charmayne Sheer MCG/ACT Exhu Generic drug: Fluticasone Propionate Spray 1-2 sprays per nostril daily What changed: You were already taking a medication with the same name, and this prescription was added. Make sure you understand how and when to take each. Changed by: Alfonse Spruce, MD   lisinopril-hydrochlorothiazide 10-12.5 MG tablet Commonly known as: ZESTORETIC Take 1 tablet by mouth daily.   montelukast 10 MG tablet Commonly known as: Singulair Take 1 tablet (10 mg total) by mouth at bedtime. Started by: Alfonse Spruce, MD   omeprazole 20 MG capsule Commonly known as: PRILOSEC Take 1 capsule (20 mg total) by mouth daily.   SINEX ULTRA FINE MIST REGULAR NA Place 2 sprays into both nostrils 3 (three) times daily as needed (congestion).   sucralfate 1 GM/10ML suspension Commonly known as: Carafate Take 10 mLs (1 g total) by mouth 4 (four) times daily -  with meals and at bedtime.   tamsulosin 0.4 MG Caps capsule Commonly known as: FLOMAX Take 0.4 mg by mouth daily.        Birth History: non-contributory  Developmental History: non-contributory  Past Surgical History: History reviewed. No pertinent surgical history.   Family History: History reviewed. No pertinent family history.   Social History: Kevin [name] lives at home with his family.  They live in a house that is 53 years old.  There is carpeting throughout the home.  They have gas heating and central cooling.  There are no animals inside or outside of the home.  There are no dust mite covers on the bedding.  There is no tobacco exposure.  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  He is not exposed to fumes, chemicals, or dust at home, although he is at work.  There is no HEPA filter in the home.  He does not live near an interstate or industrial area.   Review of Systems  Constitutional:  Negative.  Negative for fever, malaise/fatigue and weight loss.  HENT:  Positive for congestion and sinus pain. Negative for ear discharge and ear pain.   Eyes:  Negative for pain, discharge and redness.  Respiratory:  Negative for cough, sputum production, shortness of breath and wheezing.   Cardiovascular: Negative.  Negative for chest pain and palpitations.  Gastrointestinal:  Negative for abdominal pain, heartburn, nausea and vomiting.  Skin: Negative.  Negative for itching and rash.  Neurological:  Negative for dizziness and headaches.  Endo/Heme/Allergies:  Negative for environmental allergies. Does not bruise/bleed easily.      Objective:   Blood pressure 124/80, pulse 68, temperature 97.8 F (36.6 C), resp. rate 18, height 5\' 11"  (1.803 m), weight 233 lb 12.8 oz (106.1 kg), SpO2 97 %. Body mass index is 32.61 kg/m.   Physical Exam:   Physical Exam Vitals reviewed.  Constitutional:      Appearance: He is well-developed.  HENT:     Head: Normocephalic and atraumatic.     Right  Ear: Tympanic membrane, ear canal and external ear normal. No drainage, swelling or tenderness. Tympanic membrane is not injected, scarred, erythematous, retracted or bulging.     Left Ear: Tympanic membrane, ear canal and external ear normal. No drainage, swelling or tenderness. Tympanic membrane is not injected, scarred, erythematous, retracted or bulging.     Nose: No nasal deformity, septal deviation, mucosal edema or rhinorrhea.     Right Turbinates: Enlarged, swollen and pale.     Left Turbinates: Enlarged, swollen and pale.     Right Sinus: No maxillary sinus tenderness or frontal sinus tenderness.     Left Sinus: No maxillary sinus tenderness or frontal sinus tenderness.     Comments: No polyps that I could appreciate.     Mouth/Throat:     Mouth: Mucous membranes are not pale and not dry.     Pharynx: Uvula midline.  Eyes:     General:        Right eye: No discharge.        Left eye: No  discharge.     Conjunctiva/sclera: Conjunctivae normal.     Right eye: Right conjunctiva is not injected. No chemosis.    Left eye: Left conjunctiva is not injected. No chemosis.    Pupils: Pupils are equal, round, and reactive to light.  Cardiovascular:     Rate and Rhythm: Normal rate and regular rhythm.     Heart sounds: Normal heart sounds.  Pulmonary:     Effort: Pulmonary effort is normal. No tachypnea, accessory muscle usage or respiratory distress.     Breath sounds: Normal breath sounds. No wheezing, rhonchi or rales.     Comments: Moving air well in all lung fields. No increased work of breathing noted.  Chest:     Chest wall: No tenderness.  Abdominal:     Tenderness: There is no abdominal tenderness. There is no guarding or rebound.  Lymphadenopathy:     Head:     Right side of head: No submandibular, tonsillar or occipital adenopathy.     Left side of head: No submandibular, tonsillar or occipital adenopathy.     Cervical: No cervical adenopathy.  Skin:    General: Skin is warm.     Capillary Refill: Capillary refill takes less than 2 seconds.     Coloration: Skin is not pale.     Findings: No abrasion, erythema, petechiae or rash. Rash is not papular, urticarial or vesicular.     Comments: No eczematous or urticarial lesions noted.   Neurological:     Mental Status: He is alert.  Psychiatric:        Behavior: Behavior is cooperative.     Diagnostic studies:   Allergy Studies:     Airborne Adult Perc - 04/10/21 0851     Time Antigen Placed 0355    Allergen Manufacturer Waynette Buttery    Location Back    Number of Test 59    1. Control-Buffer 50% Glycerol Negative    2. Control-Histamine 1 mg/ml 2+    3. Albumin saline Negative    4. Bahia 2+    5. [place] 2+    6. Johnson Negative    7. Kentucky Blue Negative    8. Meadow Fescue Negative    9. Perennial Rye 4+    10. Sweet Vernal 2+    11. Timothy 2+    12. Cocklebur 2+    13. Burweed Marshelder 3+    14.  Ragweed, short 3+  15. Ragweed, Giant 2+    16. Plantain,  English Negative    17. Lamb's Quarters Negative    18. Sheep Sorrell Negative    19. Rough Pigweed Negative    20. Marsh Elder, Rough Negative    21. Mugwort, Common Negative    22. Ash mix 3+    23. Birch mix Negative    24. Beech American Negative    25. Box, Elder Negative    26. Cedar, red Negative    27. Cottonwood, Guinea-Bissau Negative    28. Elm mix Negative    29. Hickory 3+    30. Maple mix Negative    31. Oak, Guinea-Bissau mix 2+    32. Pecan Pollen 2+    33. Pine mix Negative    34. Sycamore Eastern Negative    35. Walnut, Black Pollen Negative    36. Alternaria alternata 2+    37. Cladosporium Herbarum Negative    38. Aspergillus mix Negative    39. Penicillium mix Negative    40. Bipolaris sorokiniana (Helminthosporium) 2+    41. Drechslera spicifera (Curvularia) 2+    42. Mucor plumbeus Negative    43. Fusarium moniliforme Negative    44. Aureobasidium pullulans (pullulara) Negative    45. Rhizopus oryzae Negative    46. Botrytis cinera Negative    47. Epicoccum nigrum Negative    48. Phoma betae Negative    49. Candida Albicans Negative    50. Trichophyton mentagrophytes Negative    51. Mite, D Farinae  5,000 AU/ml Negative    52. Mite, D Pteronyssinus  5,000 AU/ml Negative    53. Cat Hair 10,000 BAU/ml Negative    54.  Dog Epithelia Negative    55. Mixed Feathers Negative    56. Horse Epithelia Negative    57. Cockroach, German Negative    58. Mouse Negative    59. Tobacco Leaf Negative             Intradermal - 04/10/21 0920     Time Antigen Placed 0930    Allergen Manufacturer Waynette Buttery    Location Arm    Number of Test 9    Intradermal Select    Control Negative    Johnson 4+    Weed mix Negative    Mold 2 2+    Mold 4 Negative    Cat Negative    Dog Negative    Cockroach 2+    Mite mix 2+             Allergy testing results were read and interpreted by myself, documented by  clinical staff.         Malachi Bonds, MD [facility]

## 2021-04-10 NOTE — Patient Instructions (Addendum)
1. Allergic rhinoconjunctivitis, seasonal and perennial - Testing today showed: grasses, ragweed, trees, indoor molds, outdoor molds, dust mites, and cockroach - Copy of test results provided.  - Avoidance measures provided. - Stop taking: all of your current medications - Start taking: Singulair (montelukast) 10mg  daily and Xhance (fluticasone) 1-2 sprays per nostril daily - Singulair can cause depression/irritability, so beware of that and stop if this happens.  - You can use an antihistamine for breakthrough symptoms (Allegra, Zyrtec, Xyzal, etc).  - Consider nasal saline rinses 1-2 times daily to remove allergens from the nasal cavities as well as help with mucous clearance (this is especially helpful to do before the nasal sprays are given) - Consider allergy shots as a means of long-term control. - Allergy shots "re-train" and "reset" the immune system to ignore environmental allergens and decrease the resulting immune response to those allergens (sneezing, itchy watery eyes, runny nose, nasal congestion, etc).    - Allergy shots improve symptoms in 75-85% of patients.  - We can discuss more at the next appointment if the medications are not working for you.  2. Return in about 4 weeks (around 05/08/2021).    Please inform 13/01/2021 of any Emergency Department visits, hospitalizations, or changes in symptoms. Call us before going to the ED for breathing or allergy symptoms since we might be able to fit you in for a sick visit. Feel free to contact us anytime with any questions, problems, or concerns.  It was a pleasure to meet you today!  Websites that have reliable patient information: 1. American Academy of Asthma, Allergy, and Immunology: www.aaaai.org 2. Food Allergy Research and Education (FARE): foodallergy.org 3. Mothers of Asthmatics: http://www.asthmacommunitynetwork.org 4. American College of Allergy, Asthma, and Immunology: www.acaai.org   COVID-19 Vaccine Information can be  found at: [place] For questions related to vaccine distribution or appointments, please email [email or web address] or call [phone].   We realize that you might be concerned about having an allergic reaction to the COVID19 vaccines. To help with that concern, WE ARE OFFERING THE COVID19 VACCINES IN OUR OFFICE! Ask the front desk for dates!     "Like" [phone] on Facebook and Instagram for our latest updates!      A healthy democracy works best when [place] participate! Make sure you are registered to vote! If you have moved or changed any of your contact information, you will need to get this updated before voting!  In some cases, you MAY be able to register to vote online: [employer]   Prick: 1. Control-Buffer 50% Glycerol Negative   2. Control-Histamine 1 mg/ml 2+   3. Albumin saline Negative   4. Bahia 2+   5. [email or web address].be 2+   6. Johnson Negative   7. [place] Blue Negative   8. Meadow Fescue Negative   9. Perennial Rye 4+   10. Sweet Vernal 2+   11. Timothy 2+   12. Cocklebur 2+   13. Burweed Marshelder 3+   14. Ragweed, short 3+   15. Ragweed, Giant 2+   16. Plantain,  English Negative   17. Lamb's Quarters Negative   18. Sheep Sorrell Negative   19. Rough Pigweed Negative   20. Marsh Elder, Rough Negative   21. Mugwort, Common Negative   22. Ash mix 3+   23. Birch mix Negative   24. Beech American Negative   25. Box, Elder Negative   26. Cedar, red Negative   27. Cottonwood, [place] Negative   28. Elm mix Negative  29. Hickory 3+   30. Maple mix Negative   31. Oak, [place] mix 2+   32. Pecan Pollen 2+   33. Pine mix Negative   34. Sycamore Eastern Negative   35. Walnut, Black Pollen Negative   36. Alternaria alternata 2+   37. Cladosporium Herbarum Negative   38. Aspergillus mix Negative   39. Penicillium mix Negative   40. Bipolaris sorokiniana  (Helminthosporium) 2+   41. Drechslera spicifera (Curvularia) 2+   42. Mucor plumbeus Negative   43. Fusarium moniliforme Negative   44. Aureobasidium pullulans (pullulara) Negative   45. Rhizopus oryzae Negative   46. Botrytis cinera Negative   47. Epicoccum nigrum Negative   48. Phoma betae Negative   49. Candida Albicans Negative   50. Trichophyton mentagrophytes Negative   51. Mite, D Farinae  5,000 AU/ml Negative   52. Mite, D Pteronyssinus  5,000 AU/ml Negative   53. Cat Hair 10,000 BAU/ml Negative   54.  Dog Epithelia Negative   55. Mixed Feathers Negative   56. Horse Epithelia Negative   57. Cockroach, German Negative   58. Mouse Negative   59. Tobacco Leaf Negative     Intradermal:  Control Negative   Johnson 4+   Weed mix Negative   Mold 2 2+   Mold 4 Negative   Cat Negative   Dog Negative   Cockroach 2+   Mite mix 2+       Reducing Pollen Exposure  The American Academy of Allergy, Asthma and Immunology suggests the following steps to reduce your exposure to pollen during allergy seasons.    Do not hang sheets or clothing out to dry; pollen may collect on these items. Do not mow lawns or spend time around freshly cut grass; mowing stirs up pollen. Keep windows closed at night.  Keep car windows closed while driving. Minimize morning activities outdoors, a time when pollen counts are usually at their highest. Stay indoors as much as possible when pollen counts or humidity is high and on windy days when pollen tends to remain in the air longer. Use air conditioning when possible.  Many air conditioners have filters that trap the pollen spores. Use a HEPA room air filter to remove pollen form the indoor air you breathe.  Control of Mold Allergen   Mold and fungi can grow on a variety of surfaces provided certain temperature and moisture conditions exist.  Outdoor molds grow on plants, decaying vegetation and soil.  The major outdoor mold, Alternaria and  Cladosporium, are found in very high numbers during hot and dry conditions.  Generally, a late Summer - Fall peak is seen for common outdoor fungal spores.  Rain will temporarily lower outdoor mold spore count, but counts rise rapidly when the rainy period ends.  The most important indoor molds are Aspergillus and Penicillium.  Dark, humid and poorly ventilated basements are ideal sites for mold growth.  The next most common sites of mold growth are the bathroom and the kitchen.  Outdoor (Seasonal) Mold Control  Positive outdoor molds via skin testing: Alternaria, Bipolaris (Helminthsporium), and Drechslera (Curvalaria)  Use air conditioning and keep windows closed Avoid exposure to decaying vegetation. Avoid leaf raking. Avoid grain handling. Consider wearing a face mask if working in moldy areas.    Indoor (Perennial) Mold Control   Positive indoor molds via skin testing: Aspergillus and Penicillium  Maintain humidity below 50%. Clean washable surfaces with 5% bleach solution. Remove sources e.g. contaminated carpets.  Control of Dust Mite Allergen    Dust mites play a major role in allergic asthma and rhinitis.  They occur in environments with high humidity wherever human skin is found.  Dust mites absorb humidity from the atmosphere (ie, they do not drink) and feed on organic matter (including shed human and animal skin).  Dust mites are a microscopic type of insect that you cannot see with the naked eye.  High levels of dust mites have been detected from mattresses, pillows, carpets, upholstered furniture, bed covers, clothes, soft toys and any woven material.  The principal allergen of the dust mite is found in its feces.  A gram of dust may contain 1,000 mites and 250,000 fecal particles.  Mite antigen is easily measured in the air during house cleaning activities.  Dust mites do not bite and do not cause harm to humans, other than by triggering allergies/asthma.    Ways to  decrease your exposure to dust mites in your home:  Encase mattresses, box springs and pillows with a mite-impermeable barrier or cover   Wash sheets, blankets and drapes weekly in hot water (130 F) with detergent and dry them in a dryer on the hot setting.  Have the room cleaned frequently with a vacuum cleaner and a damp dust-mop.  For carpeting or rugs, vacuuming with a vacuum cleaner equipped with a high-efficiency particulate air (HEPA) filter.  The dust mite allergic individual should not be in a room which is being cleaned and should wait 1 hour after cleaning before going into the room. Do not sleep on upholstered furniture (eg, couches).   If possible removing carpeting, upholstered furniture and drapery from the home is ideal.  Horizontal blinds should be eliminated in the rooms where the person spends the most time (bedroom, study, television room).  Washable vinyl, roller-type shades are optimal. Remove all non-washable stuffed toys from the bedroom.  Wash stuffed toys weekly like sheets and blankets above.   Reduce indoor humidity to less than 50%.  Inexpensive humidity monitors can be purchased at most hardware stores.  Do not use a humidifier as can make the problem worse and are not recommended.  Control of Cockroach Allergen  Cockroach allergen has been identified as an important cause of acute attacks of asthma, especially in urban settings.  There are fifty-five species of cockroach that exist in the [place], however only three, the [place], [place] species produce allergen that can affect patients with Asthma.  Allergens can be obtained from fecal particles, egg casings and secretions from cockroaches.    Remove food sources. Reduce access to water. Seal access and entry points. Spray runways with 0.5-1% Diazinon or Chlorpyrifos Blow boric acid power under stoves and refrigerator. Place bait stations (hydramethylnon) at feeding sites.  Allergy Shots    Allergies are the result of a chain reaction that starts in the immune system. Your immune system controls how your body defends itself. For instance, if you have an allergy to pollen, your immune system identifies pollen as an invader or allergen. Your immune system overreacts by producing antibodies called Immunoglobulin E (IgE). These antibodies travel to cells that release chemicals, causing an allergic reaction.  The concept behind allergy immunotherapy, whether it is received in the form of shots or tablets, is that the immune system can be desensitized to specific allergens that trigger allergy symptoms. Although it requires time and patience, the payback can be long-term relief.  How Do Allergy Shots Work?  Allergy shots  work much like a vaccine. Your body responds to injected amounts of a particular allergen given in increasing doses, eventually developing a resistance and tolerance to it. Allergy shots can lead to decreased, minimal or no allergy symptoms.  There generally are two phases: build-up and maintenance. Build-up often ranges from three to six months and involves receiving injections with increasing amounts of the allergens. The shots are typically given once or twice a week, though more rapid build-up schedules are sometimes used.  The maintenance phase begins when the most effective dose is reached. This dose is different for each person, depending on how allergic you are and your response to the build-up injections. Once the maintenance dose is reached, there are longer periods between injections, typically two to four weeks.  Occasionally doctors give cortisone-type shots that can temporarily reduce allergy symptoms. These types of shots are different and should not be confused with allergy immunotherapy shots.  Who Can Be Treated with Allergy Shots?  Allergy shots may be a good treatment approach for people with allergic rhinitis (hay fever), allergic asthma,  conjunctivitis (eye allergy) or stinging insect allergy.   Before deciding to begin allergy shots, you should consider:   The length of allergy season and the severity of your symptoms  Whether medications and/or changes to your environment can control your symptoms  Your desire to avoid long-term medication use  Time: allergy immunotherapy requires a major time commitment  Cost: may vary depending on your insurance coverage  Allergy shots for children age 11 and older are effective and often well tolerated. They might prevent the onset of new allergen sensitivities or the progression to asthma.  Allergy shots are not started on patients who are pregnant but can be continued on patients who become pregnant while receiving them. In some patients with other medical conditions or who take certain common medications, allergy shots may be of risk. It is important to mention other medications you talk to your allergist.   When Will I Feel Better?  Some may experience decreased allergy symptoms during the build-up phase. For others, it may take as long as 12 months on the maintenance dose. If there is no improvement after a year of maintenance, your allergist will discuss other treatment options with you.  If you aren't responding to allergy shots, it may be because there is not enough dose of the allergen in your vaccine or there are missing allergens that were not identified during your allergy testing. Other reasons could be that there are high levels of the allergen in your environment or major exposure to non-allergic triggers like tobacco smoke.  What Is the Length of Treatment?  Once the maintenance dose is reached, allergy shots are generally continued for three to five years. The decision to stop should be discussed with your allergist at that time. Some people may experience a permanent reduction of allergy symptoms. Others may relapse and a longer course of allergy shots can be  considered.  What Are the Possible Reactions?  The two types of adverse reactions that can occur with allergy shots are local and systemic. Common local reactions include very mild redness and swelling at the injection site, which can happen immediately or several hours after. A systemic reaction, which is less common, affects the entire body or a particular body system. They are usually mild and typically respond quickly to medications. Signs include increased allergy symptoms such as sneezing, a stuffy nose or hives.  Rarely, a serious systemic reaction called  anaphylaxis can develop. Symptoms include swelling in the throat, wheezing, a feeling of tightness in the chest, nausea or dizziness. Most serious systemic reactions develop within 30 minutes of allergy shots. This is why it is strongly recommended you wait in your doctor's office for 30 minutes after your injections. Your allergist is trained to watch for reactions, and his or her staff is trained and equipped with the proper medications to identify and treat them.  Who Should Administer Allergy Shots?  The preferred location for receiving shots is your prescribing allergist's office. Injections can sometimes be given at another facility where the physician and staff are trained to recognize and treat reactions, and have received instructions by your prescribing allergist.

## 2021-05-17 ENCOUNTER — Encounter (HOSPITAL_BASED_OUTPATIENT_CLINIC_OR_DEPARTMENT_OTHER): Payer: Self-pay | Admitting: Otolaryngology

## 2021-05-17 ENCOUNTER — Other Ambulatory Visit: Payer: Self-pay | Admitting: Otolaryngology

## 2021-05-22 ENCOUNTER — Encounter (HOSPITAL_BASED_OUTPATIENT_CLINIC_OR_DEPARTMENT_OTHER)
Admission: RE | Admit: 2021-05-22 | Discharge: 2021-05-22 | Disposition: A | Discharge status: Home / Self Care | Payer: BC Managed Care – PPO | Source: Ambulatory Visit | Attending: Otolaryngology | Admitting: Otolaryngology

## 2021-05-22 DIAGNOSIS — Z01818 Encounter for other preprocedural examination: Secondary | ICD-10-CM | POA: Insufficient documentation

## 2021-05-22 DIAGNOSIS — I10 Essential (primary) hypertension: Secondary | ICD-10-CM | POA: Diagnosis not present

## 2021-05-22 DIAGNOSIS — J3489 Other specified disorders of nose and nasal sinuses: Secondary | ICD-10-CM | POA: Diagnosis not present

## 2021-05-22 DIAGNOSIS — J343 Hypertrophy of nasal turbinates: Secondary | ICD-10-CM | POA: Diagnosis not present

## 2021-05-22 DIAGNOSIS — J342 Deviated nasal septum: Secondary | ICD-10-CM | POA: Diagnosis present

## 2021-05-22 DIAGNOSIS — Z79899 Other long term (current) drug therapy: Secondary | ICD-10-CM | POA: Diagnosis not present

## 2021-05-22 DIAGNOSIS — G4730 Sleep apnea, unspecified: Secondary | ICD-10-CM | POA: Diagnosis not present

## 2021-05-22 LAB — BASIC METABOLIC PANEL
Anion gap: 8 (ref 5–15)
BUN: 14 mg/dL (ref 6–20)
CO2: 25 mmol/L (ref 22–32)
Calcium: 9.2 mg/dL (ref 8.9–10.3)
Chloride: 104 mmol/L (ref 98–111)
Creatinine, Ser: 0.83 mg/dL (ref 0.61–1.24)
GFR, Estimated: >60 mL/min (ref >60)
Glucose, Bld: 93 mg/dL (ref 70–99)
Potassium: 4.3 mmol/L (ref 3.5–5.1)
Sodium: 137 mmol/L (ref 135–145)

## 2021-05-23 ENCOUNTER — Ambulatory Visit (HOSPITAL_BASED_OUTPATIENT_CLINIC_OR_DEPARTMENT_OTHER): Payer: BC Managed Care – PPO | Admitting: Certified Registered"

## 2021-05-23 ENCOUNTER — Encounter (HOSPITAL_BASED_OUTPATIENT_CLINIC_OR_DEPARTMENT_OTHER): Payer: Self-pay | Admitting: Otolaryngology

## 2021-05-23 ENCOUNTER — Other Ambulatory Visit: Payer: Self-pay

## 2021-05-23 ENCOUNTER — Ambulatory Visit (HOSPITAL_BASED_OUTPATIENT_CLINIC_OR_DEPARTMENT_OTHER)
Admission: RE | Admit: 2021-05-23 | Discharge: 2021-05-23 | Disposition: A | Discharge status: Home / Self Care | Payer: BC Managed Care – PPO | Attending: Otolaryngology | Admitting: Otolaryngology

## 2021-05-23 ENCOUNTER — Encounter (HOSPITAL_BASED_OUTPATIENT_CLINIC_OR_DEPARTMENT_OTHER)
Admission: RE | Disposition: A | Discharge status: Home / Self Care | Payer: Self-pay | Source: Home / Self Care | Attending: Otolaryngology

## 2021-05-23 DIAGNOSIS — J342 Deviated nasal septum: Secondary | ICD-10-CM

## 2021-05-23 DIAGNOSIS — J343 Hypertrophy of nasal turbinates: Secondary | ICD-10-CM | POA: Insufficient documentation

## 2021-05-23 DIAGNOSIS — H1010 Acute atopic conjunctivitis, unspecified eye: Secondary | ICD-10-CM

## 2021-05-23 DIAGNOSIS — Z79899 Other long term (current) drug therapy: Secondary | ICD-10-CM | POA: Insufficient documentation

## 2021-05-23 DIAGNOSIS — I10 Essential (primary) hypertension: Secondary | ICD-10-CM | POA: Insufficient documentation

## 2021-05-23 DIAGNOSIS — G4730 Sleep apnea, unspecified: Secondary | ICD-10-CM | POA: Insufficient documentation

## 2021-05-23 DIAGNOSIS — J3489 Other specified disorders of nose and nasal sinuses: Secondary | ICD-10-CM | POA: Insufficient documentation

## 2021-05-23 HISTORY — DX: Sleep apnea, unspecified: G47.30

## 2021-05-23 HISTORY — PX: NASAL SEPTOPLASTY W/ TURBINOPLASTY: SHX2070

## 2021-05-23 SURGERY — SEPTOPLASTY, NOSE, WITH NASAL TURBINATE REDUCTION
Anesthesia: General | Site: Nose | Laterality: Bilateral

## 2021-05-23 MED ORDER — MUPIROCIN 2 % EX OINT
TOPICAL_OINTMENT | CUTANEOUS | Status: AC
  Filled 2021-05-23: qty 22

## 2021-05-23 MED ORDER — LIDOCAINE HCL (CARDIAC) PF 100 MG/5ML IV SOSY
PREFILLED_SYRINGE | INTRAVENOUS | Status: DC | PRN
  Administered 2021-05-23: 60 mg via INTRAVENOUS

## 2021-05-23 MED ORDER — DEXAMETHASONE SODIUM PHOSPHATE 4 MG/ML IJ SOLN
INTRAMUSCULAR | Status: DC | PRN
  Administered 2021-05-23: 4 mg via INTRAVENOUS

## 2021-05-23 MED ORDER — LIDOCAINE-EPINEPHRINE 1 %-1:100000 IJ SOLN
INTRAMUSCULAR | Status: AC
  Filled 2021-05-23: qty 1

## 2021-05-23 MED ORDER — OXYCODONE HCL 5 MG/5ML PO SOLN: 5.0000 mg | Freq: Once | ORAL | Status: AC | PRN

## 2021-05-23 MED ORDER — AMISULPRIDE (ANTIEMETIC) 5 MG/2ML IV SOLN: 10.0000 mg | Freq: Once | INTRAVENOUS | Status: DC | PRN

## 2021-05-23 MED ORDER — FENTANYL CITRATE (PF) 100 MCG/2ML IJ SOLN
INTRAMUSCULAR | Status: AC
  Filled 2021-05-23: qty 2

## 2021-05-23 MED ORDER — MEPERIDINE HCL 25 MG/ML IJ SOLN: 6.2500 mg | INTRAMUSCULAR | Status: DC | PRN

## 2021-05-23 MED ORDER — PROPOFOL 10 MG/ML IV BOLUS
INTRAVENOUS | Status: DC | PRN
  Administered 2021-05-23: 200 mg via INTRAVENOUS

## 2021-05-23 MED ORDER — PROMETHAZINE HCL 25 MG/ML IJ SOLN: 6.2500 mg | INTRAMUSCULAR | Status: DC | PRN

## 2021-05-23 MED ORDER — MIDAZOLAM HCL 5 MG/5ML IJ SOLN
INTRAMUSCULAR | Status: DC | PRN
  Administered 2021-05-23: 2 mg via INTRAVENOUS

## 2021-05-23 MED ORDER — ROCURONIUM BROMIDE 100 MG/10ML IV SOLN
INTRAVENOUS | Status: DC | PRN
  Administered 2021-05-23: 80 mg via INTRAVENOUS

## 2021-05-23 MED ORDER — HYDROMORPHONE HCL 1 MG/ML IJ SOLN
0.2500 mg | INTRAMUSCULAR | Status: DC | PRN
Start: 2021-05-23 — End: 2021-05-23

## 2021-05-23 MED ORDER — BACITRACIN ZINC 500 UNIT/GM EX OINT
TOPICAL_OINTMENT | CUTANEOUS | Status: AC
  Filled 2021-05-23: qty 0.9

## 2021-05-23 MED ORDER — LACTATED RINGERS IV SOLN: INTRAVENOUS | Status: DC

## 2021-05-23 MED ORDER — OXYCODONE HCL 5 MG PO TABS
ORAL_TABLET | ORAL | Status: AC
  Filled 2021-05-23: qty 1

## 2021-05-23 MED ORDER — MUPIROCIN 2 % EX OINT
TOPICAL_OINTMENT | CUTANEOUS | Status: DC | PRN
  Administered 2021-05-23: 1 via NASAL

## 2021-05-23 MED ORDER — CEPHALEXIN 500 MG PO CAPS
500.0000 mg | ORAL_CAPSULE | Freq: Three times a day (TID) | ORAL | 0 refills | Status: AC
Start: 2021-05-23 — End: 2021-05-30

## 2021-05-23 MED ORDER — SUGAMMADEX SODIUM 200 MG/2ML IV SOLN
INTRAVENOUS | Status: DC | PRN
  Administered 2021-05-23: 200 mg via INTRAVENOUS

## 2021-05-23 MED ORDER — FENTANYL CITRATE (PF) 100 MCG/2ML IJ SOLN
INTRAMUSCULAR | Status: DC | PRN
  Administered 2021-05-23: 50 ug via INTRAVENOUS

## 2021-05-23 MED ORDER — OXYMETAZOLINE HCL 0.05 % NA SOLN
NASAL | Status: DC | PRN
  Administered 2021-05-23: 1 via TOPICAL

## 2021-05-23 MED ORDER — CEFAZOLIN SODIUM-DEXTROSE 2-4 GM/100ML-% IV SOLN
INTRAVENOUS | Status: AC
  Filled 2021-05-23: qty 100

## 2021-05-23 MED ORDER — OXYCODONE HCL 5 MG PO TABS
5.0000 mg | ORAL_TABLET | Freq: Once | ORAL | Status: AC | PRN
  Administered 2021-05-23: 5 mg via ORAL

## 2021-05-23 MED ORDER — OXYMETAZOLINE HCL 0.05 % NA SOLN
NASAL | Status: AC
  Filled 2021-05-23: qty 30

## 2021-05-23 MED ORDER — LIDOCAINE-EPINEPHRINE 1 %-1:100000 IJ SOLN
INTRAMUSCULAR | Status: DC | PRN
  Administered 2021-05-23: 5 mL

## 2021-05-23 MED ORDER — DROPERIDOL 2.5 MG/ML IJ SOLN
INTRAMUSCULAR | Status: DC | PRN
  Administered 2021-05-23: .625 mg via INTRAVENOUS

## 2021-05-23 MED ORDER — MIDAZOLAM HCL 2 MG/2ML IJ SOLN
INTRAMUSCULAR | Status: AC
  Filled 2021-05-23: qty 2

## 2021-05-23 MED ORDER — ONDANSETRON HCL 4 MG/2ML IJ SOLN
INTRAMUSCULAR | Status: DC | PRN
  Administered 2021-05-23: 4 mg via INTRAVENOUS

## 2021-05-23 SURGICAL SUPPLY — 32 items
ATTRACTOMAT 16X20 MAGNETIC DRP (DRAPES) ×2 IMPLANT
BLADE SURG 15 STRL LF DISP TIS (BLADE) IMPLANT
BLADE SURG 15 STRL SS (BLADE)
CANISTER SUCT 1200ML W/VALVE (MISCELLANEOUS) ×3 IMPLANT
COAGULATOR SUCT 8FR VV (MISCELLANEOUS) IMPLANT
DECANTER SPIKE VIAL GLASS SM (MISCELLANEOUS) IMPLANT
DRSG NASOPORE 8CM (GAUZE/BANDAGES/DRESSINGS) IMPLANT
DRSG TELFA 3X8 NADH (GAUZE/BANDAGES/DRESSINGS) IMPLANT
ELECT REM PT RETURN 9FT ADLT (ELECTROSURGICAL)
ELECTRODE REM PT RTRN 9FT ADLT (ELECTROSURGICAL) IMPLANT
GLOVE SURG ENC TEXT LTX SZ7 (GLOVE) ×6 IMPLANT
GOWN STRL REUS W/ TWL LRG LVL3 (GOWN DISPOSABLE) ×2 IMPLANT
GOWN STRL REUS W/TWL LRG LVL3 (GOWN DISPOSABLE) ×6
IV SET EXT 30 76VOL 4 MALE LL (IV SETS) ×3 IMPLANT
NDL HYPO 27GX1-1/4 (NEEDLE) ×1 IMPLANT
NEEDLE HYPO 27GX1-1/4 (NEEDLE) ×3 IMPLANT
NS IRRIG 1000ML POUR BTL (IV SOLUTION) IMPLANT
PACK BASIN DAY SURGERY FS (CUSTOM PROCEDURE TRAY) ×3 IMPLANT
PACK ENT DAY SURGERY (CUSTOM PROCEDURE TRAY) ×3 IMPLANT
PAD DRESSING TELFA 3X8 NADH (GAUZE/BANDAGES/DRESSINGS) IMPLANT
SLEEVE SCD COMPRESS KNEE MED (STOCKING) ×2 IMPLANT
SPLINT NASAL AIRWAY SILICONE (MISCELLANEOUS) ×3 IMPLANT
SPONGE GAUZE 2X2 8PLY STER LF (GAUZE/BANDAGES/DRESSINGS) ×1
SPONGE GAUZE 2X2 8PLY STRL LF (GAUZE/BANDAGES/DRESSINGS) ×2 IMPLANT
SPONGE NEURO XRAY DETECT 1X3 (DISPOSABLE) ×3 IMPLANT
SPONGE SURGIFOAM ABS GEL 12-7 (HEMOSTASIS) IMPLANT
SUT ETHILON 3 0 PS 1 (SUTURE) ×3 IMPLANT
SUT PLAIN 4 0 ~~LOC~~ 1 (SUTURE) ×3 IMPLANT
TOWEL GREEN STERILE FF (TOWEL DISPOSABLE) ×3 IMPLANT
TUBE SALEM SUMP 12R W/ARV (TUBING) IMPLANT
TUBE SALEM SUMP 16 FR W/ARV (TUBING) ×2 IMPLANT
YANKAUER SUCT BULB TIP NO VENT (SUCTIONS) ×3 IMPLANT

## 2021-05-23 NOTE — H&P (Signed)
Kevin [name] is an 55 y.o. male.   Chief Complaint: Nasal Obstruction HPI: Hx of progressive nasal obstruction  Past Medical History:  Diagnosis Date   Hypertension    Sleep apnea     Past Surgical History:  Procedure Laterality Date   NO PAST SURGERIES      History reviewed. No pertinent family history. Social History:  reports that he has never smoked. He has never used smokeless tobacco. He reports that he does not currently use alcohol. He reports that he does not use drugs.  Allergies: No Known Allergies  Medications Prior to Admission  Medication Sig Dispense Refill   Fluticasone Propionate (XHANCE) 93 MCG/ACT EXHU Spray 1-2 sprays per nostril daily 16 mL 5   lisinopril-hydrochlorothiazide (ZESTORETIC) 10-12.5 MG tablet Take 1 tablet by mouth daily.     tamsulosin (FLOMAX) 0.4 MG CAPS capsule Take 0.4 mg by mouth daily.      Results for orders placed or performed during the hospital encounter of 05/23/21 (from the past 48 hour(s))  Basic metabolic panel per protocol     Status: None   Collection Time: 05/22/21 12:30 PM  Result Value Ref Range   Sodium 137 135 - 145 mmol/L   Potassium 4.3 3.5 - 5.1 mmol/L   Chloride 104 98 - 111 mmol/L   CO2 25 22 - 32 mmol/L   Glucose, Bld 93 70 - 99 mg/dL    Comment: Glucose reference range applies only to samples taken after fasting for at least 8 hours.   BUN 14 6 - 20 mg/dL   Creatinine, Ser 9.48 0.61 - 1.24 mg/dL   Calcium 9.2 8.9 - 54.6 mg/dL   GFR, Estimated >27 >03 mL/min    Comment: (NOTE) Calculated using the CKD-EPI Creatinine Equation (2021)    Anion gap 8 5 - 15    Comment: Performed at [facility], [address] [place], [place] [address]   No results found.  Review of Systems  Constitutional: Negative.   HENT:  Positive for congestion.   Respiratory: Negative.     Blood pressure (!) 127/94, pulse 73, temperature 98.8 F (37.1 C), temperature source Oral, resp. rate 14, height 5\' 11"  (1.803 m),  weight 108.5 kg, SpO2 99 %. Physical Exam Constitutional:      Appearance: He is normal weight.  HENT:     Nose:     Comments: Deviated septum Cardiovascular:     Rate and Rhythm: Normal rate.  Pulmonary:     Effort: Pulmonary effort is normal.  Musculoskeletal:     Cervical back: Normal range of motion.  Neurological:     Mental Status: He is alert.     Assessment/Plan Adm for OP septoplasty and IT reduction  , MD 05/23/2021, 8:29 AM

## 2021-05-23 NOTE — Op Note (Signed)
Operative Note: SEPTOPLASTY AND INFERIOR TURBINATE REDUCTION  Patient: Kevin [name]  Medical record number: [id number]  Date:05/23/2021  Pre-operative Indications: 1. Deviated nasal septum with nasal airway obstruction     2.  Bilateral inferior turbinate hypertrophy  Postoperative Indications: Same  Surgical Procedure: 1.  Nasal Septoplasty    2.  Bilateral Inferior Turbinate Reduction  Anesthesia: GET  Surgeon: Barbee Cough, M.D.  Complications: None  EBL: 50 cc  Findings: Severely deviated nasal septum with airway obstruction and bilateral inferior turbinate hypertrophy.   Brief History: The patient is a 55 y.o. male with a history of progressive nasal airway obstruction. The patient has been on medical therapy to reduce nasal mucosal edema including saline nasal spray and topical nasal steroids. Despite appropriate medical therapy the patient continues to have ongoing symptoms. Given the patient's history and findings, the above surgical procedures were recommended, risks and benefits were discussed in detail with the patient may understand and agree with our plan for surgery which is scheduled at [facility] under general anesthesia as an outpatient.  Surgical Procedure: The patient is brought to the operating room on 05/23/2021 and placed in supine position on the operating table. General endotracheal anesthesia was established without difficulty. When the patient was adequately anesthetized, surgical timeout was performed and correct identification of the patient and the surgical procedure. The patient's nose was then injected with  5 cc of 1% lidocaine 1 100,000 dilution epinephrine which was injected in a submucosal fashion. The patient's nose was then packed with Afrin-soaked cottonoid pledgets were left in place for approximately 10 minutes lateral vasoconstriction and hemostasis.  With the patient prepped draped and prepared for surgery, nasal septoplasty was begun.  A  left anterior hemitransfixion incision was created and a mucoperichondrial flap was elevated from anterior to posterior on the left-hand side. The anterior cartilaginous septum was crossed at the midline and a mucoperichondrial flap was elevated on the patient's right.  Swivel knife was then used to resect the anterior and mid cartilaginous portion of the nasal septum.  Resected cartilage was morcellized and returned to the mucoperichondrial pocket at the occlusion of the surgical procedure.  Dissection was then carried out from anterior to posterior removing deviated bone and cartilage including a large septal spur the overlying mucosa was preserved.  With the septum brought to good midline position, the morselized cartilage was returned to the mucoperichondrial pocket and the soft tissue/mucosal flaps were reapproximated with interrupted 4-0 gut suture on a Keith needle in a horizontal mattressing fashion.  Anterior hemitransfixion incision was closed with the same stitch.  Bilateral Doyle nasal septal splints were then placed after the application of Bactroban ointment and sutured in position with a 3-0 Ethilon suture.  Attention was then turned to the inferior turbinates, bilateral inferior turbinate intramural cautery was performed with cautery setting at 12 W.  2 submucosal passes were made in each inferior turbinate.  After completing cautery, anterior vertical incisions were created and overlying soft tissue was elevated, a small amount of turbinate bone was resected.  The turbinates were then outfractured to create a more patent nasal passageway.  Surgical sponge count was correct. An oral gastric tube was passed and the stomach contents were aspirated. Patient was awakened from anesthetic and transferred from the operating room to the recovery room in stable condition. There were no complications and blood loss was 50cc.   Barbee Cough, M.D. [facility] 05/23/2021

## 2021-05-23 NOTE — Transfer of Care (Signed)
Immediate Anesthesia Transfer of Care Note  Patient: Kevin [name] IV  Procedure(s) Performed: NASAL SEPTOPLASTY WITH BILATERAL INFERIOR TURBINATE REDUCTION (Bilateral: Nose)  Patient Location: PACU  Anesthesia Type:General  Level of Consciousness: awake, alert , oriented and patient cooperative  Airway & Oxygen Therapy: Patient Spontanous Breathing and Patient connected to face mask oxygen  Post-op Assessment: Report given to RN and Post -op Vital signs reviewed and stable  Post vital signs: Reviewed and stable  Last Vitals:  Vitals Value Taken Time  BP    Temp    Pulse 85 05/23/21 0952  Resp 16 05/23/21 0952  SpO2 100 % 05/23/21 0952  Vitals shown include unvalidated device data.  Last Pain:  Vitals:   05/23/21 0809  TempSrc: Oral  PainSc: 0-No pain         Complications: No notable events documented.

## 2021-05-23 NOTE — Anesthesia Procedure Notes (Signed)
Procedure Name: Intubation Date/Time: 05/23/2021 8:59 AM Performed by: Signe Colt, CRNA Pre-anesthesia Checklist: Patient identified, Emergency Drugs available, Suction available and Patient being monitored Patient Re-evaluated:Patient Re-evaluated prior to induction Oxygen Delivery Method: Circle system utilized Preoxygenation: Pre-oxygenation with 100% oxygen Induction Type: IV induction Ventilation: Mask ventilation without difficulty Laryngoscope Size: Mac and 3 Grade View: Grade II Tube type: Oral Tube size: 7.0 mm Number of attempts: 1 Airway Equipment and Method: Stylet and Oral airway Placement Confirmation: ETT inserted through vocal cords under direct vision, positive ETCO2 and breath sounds checked- equal and bilateral Secured at: 21 cm Tube secured with: Tape Dental Injury: Teeth and Oropharynx as per pre-operative assessment

## 2021-05-23 NOTE — Anesthesia Postprocedure Evaluation (Signed)
Anesthesia Post Note  Patient: Kevin [name]  Procedure(s) Performed: NASAL SEPTOPLASTY WITH BILATERAL INFERIOR TURBINATE REDUCTION (Bilateral: Nose)     Patient location during evaluation: PACU Anesthesia Type: General Level of consciousness: awake and alert Pain management: pain level controlled Vital Signs Assessment: post-procedure vital signs reviewed and stable Respiratory status: spontaneous breathing, nonlabored ventilation and respiratory function stable Cardiovascular status: blood pressure returned to baseline and stable Postop Assessment: no apparent nausea or vomiting Anesthetic complications: no   No notable events documented.  Last Vitals:  Vitals:   05/23/21 1000 05/23/21 1018  BP: 119/73 133/77  Pulse: 80   Resp: 15 20  Temp:  36.9 C  SpO2: 100% 96%    Last Pain:  Vitals:   05/23/21 1018  TempSrc: Oral  PainSc: 5                  Lowella Curb

## 2021-05-23 NOTE — Anesthesia Procedure Notes (Signed)
Date/Time: 05/23/2021 8:59 AM Performed by: Sheryn Bison, CRNA

## 2021-05-23 NOTE — Anesthesia Preprocedure Evaluation (Signed)
Anesthesia Evaluation  Patient identified by MRN, date of birth, ID band Patient awake    Reviewed: Allergy & Precautions, H&P , NPO status , Patient's Chart, lab work & pertinent test results  Airway Mallampati: II  TM Distance: >3 FB Neck ROM: Full    Dental no notable dental hx.    Pulmonary sleep apnea ,    Pulmonary exam normal breath sounds clear to auscultation       Cardiovascular hypertension, Pt. on medications negative cardio ROS Normal cardiovascular exam Rhythm:Regular Rate:Normal     Neuro/Psych negative neurological ROS  negative psych ROS   GI/Hepatic negative GI ROS, Neg liver ROS,   Endo/Other  negative endocrine ROS  Renal/GU negative Renal ROS  negative genitourinary   Musculoskeletal negative musculoskeletal ROS (+)   Abdominal (+) + obese,   Peds negative pediatric ROS (+)  Hematology negative hematology ROS (+)   Anesthesia Other Findings   Reproductive/Obstetrics negative OB ROS                             Anesthesia Physical Anesthesia Plan  ASA: 2  Anesthesia Plan: General   Post-op Pain Management:    Induction: Intravenous  PONV Risk Score and Plan: 2 and Ondansetron, Midazolam and Treatment may vary due to age or medical condition  Airway Management Planned: Oral ETT  Additional Equipment:   Intra-op Plan:   Post-operative Plan: Extubation in OR  Informed Consent: I have reviewed the patients History and Physical, chart, labs and discussed the procedure including the risks, benefits and alternatives for the proposed anesthesia with the patient or authorized representative who has indicated his/her understanding and acceptance.     Dental advisory given  Plan Discussed with: CRNA  Anesthesia Plan Comments:         Anesthesia Quick Evaluation

## 2021-05-28 ENCOUNTER — Encounter (HOSPITAL_BASED_OUTPATIENT_CLINIC_OR_DEPARTMENT_OTHER): Payer: Self-pay | Admitting: Otolaryngology

## 2021-09-18 ENCOUNTER — Other Ambulatory Visit: Payer: Self-pay

## 2021-09-27 ENCOUNTER — Other Ambulatory Visit: Payer: Self-pay

## 2021-12-04 IMAGING — DX DG KNEE 3 VIEWS*L*
3 series · 3 of 3 positions shown · non-contrast
Comparison: None.

CLINICAL DATA: Chronic LEFT knee pain without injury.

EXAM:
LEFT KNEE - 3 VIEW

[dg knee 3 views right (1 of 3)]
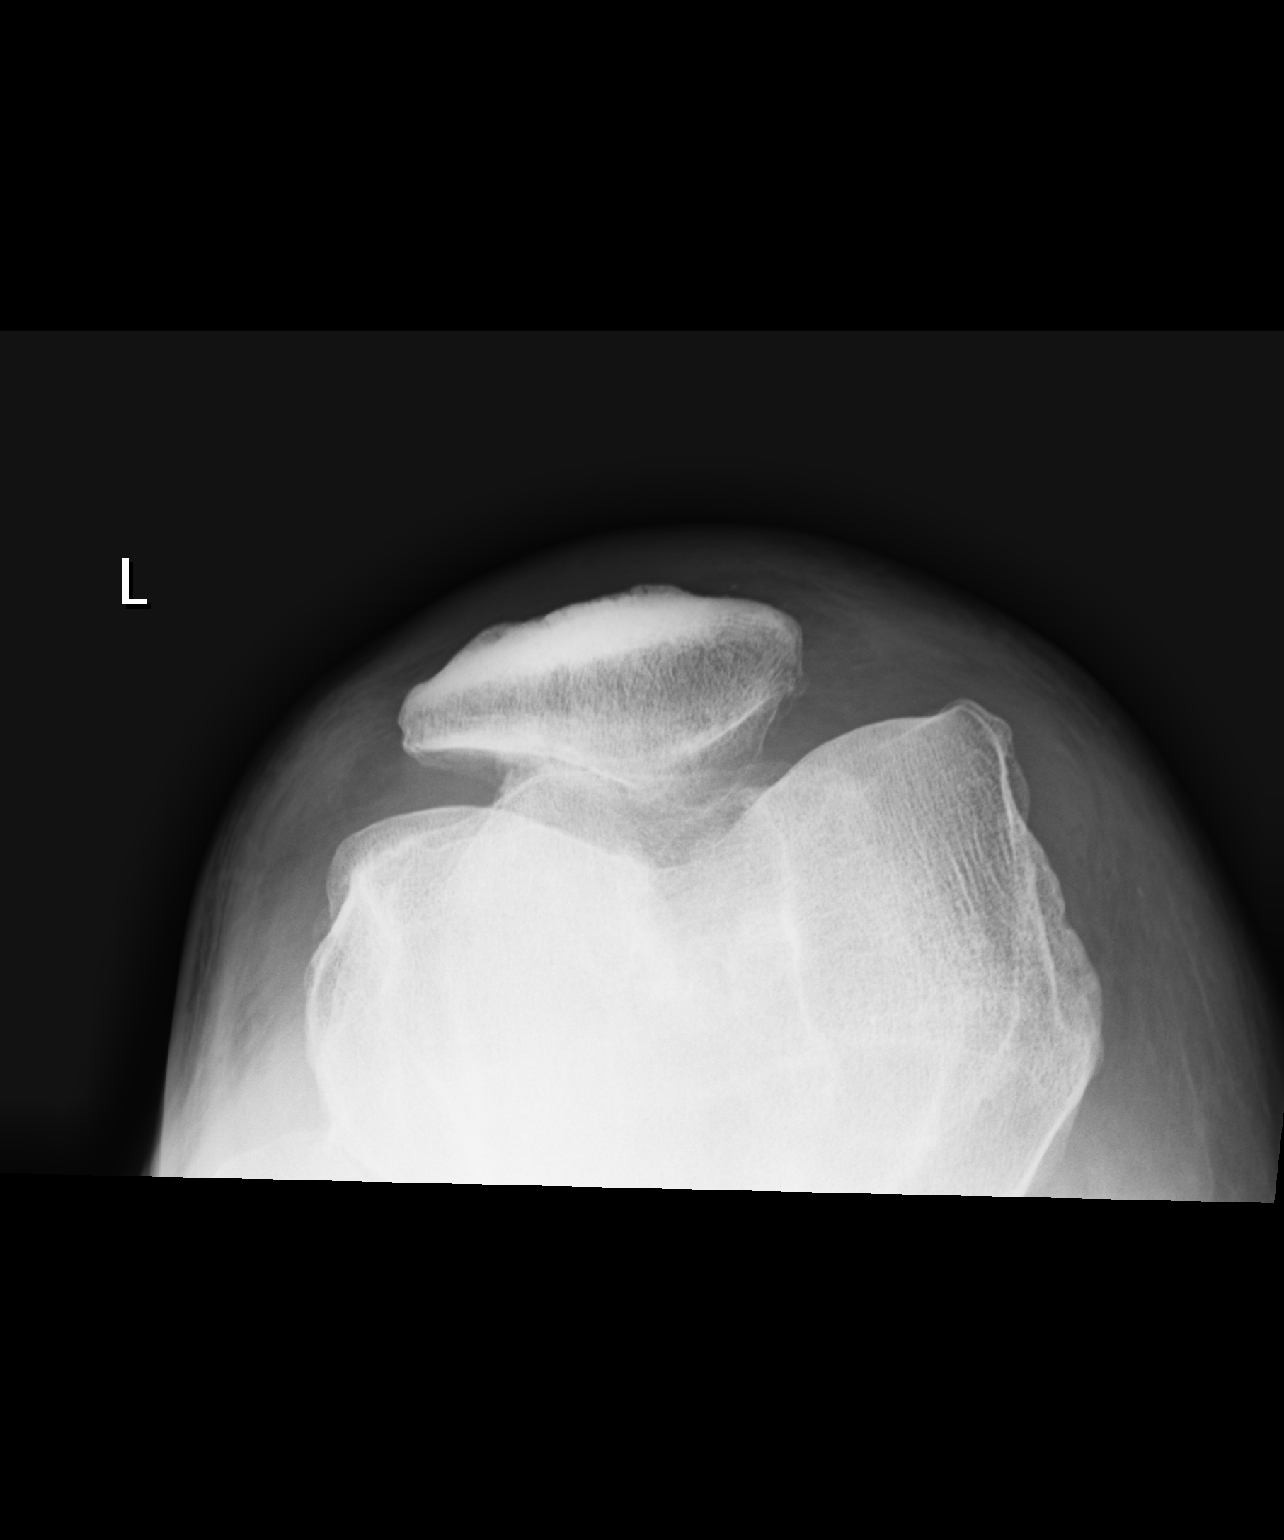

[dg knee 3 views right (2 of 3)]
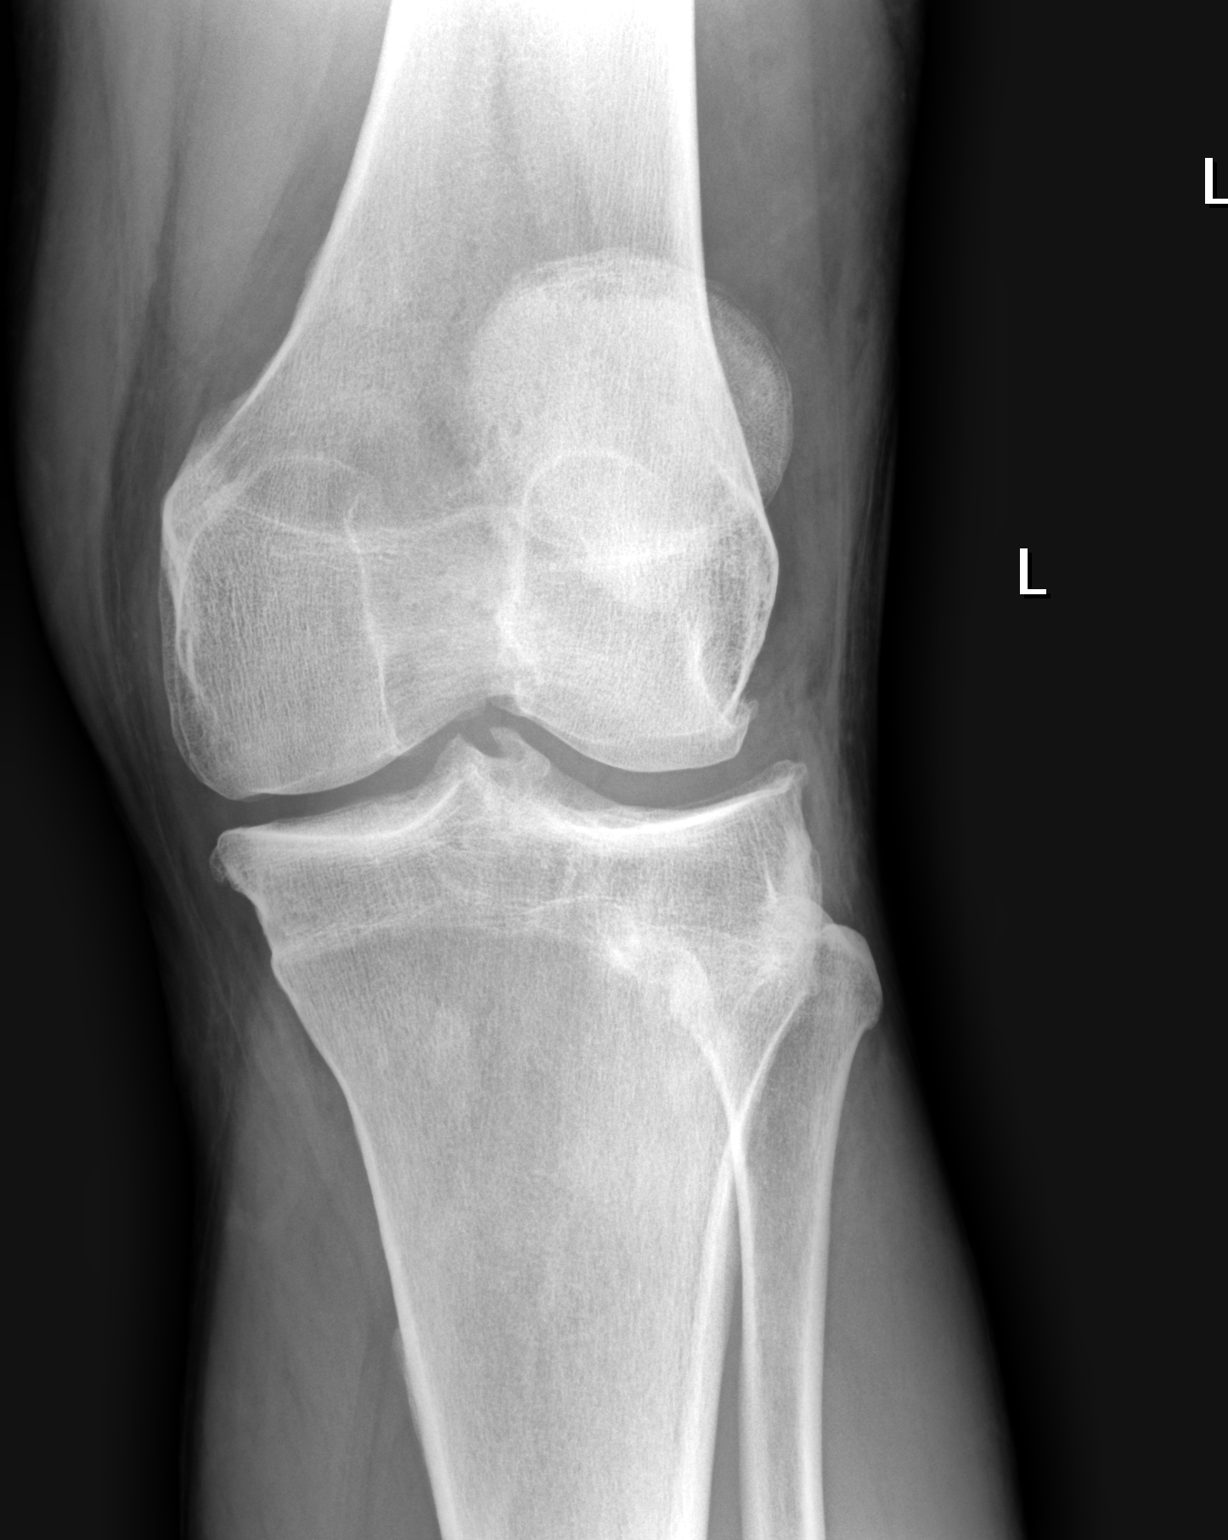

[dg knee 3 views right (3 of 3)]
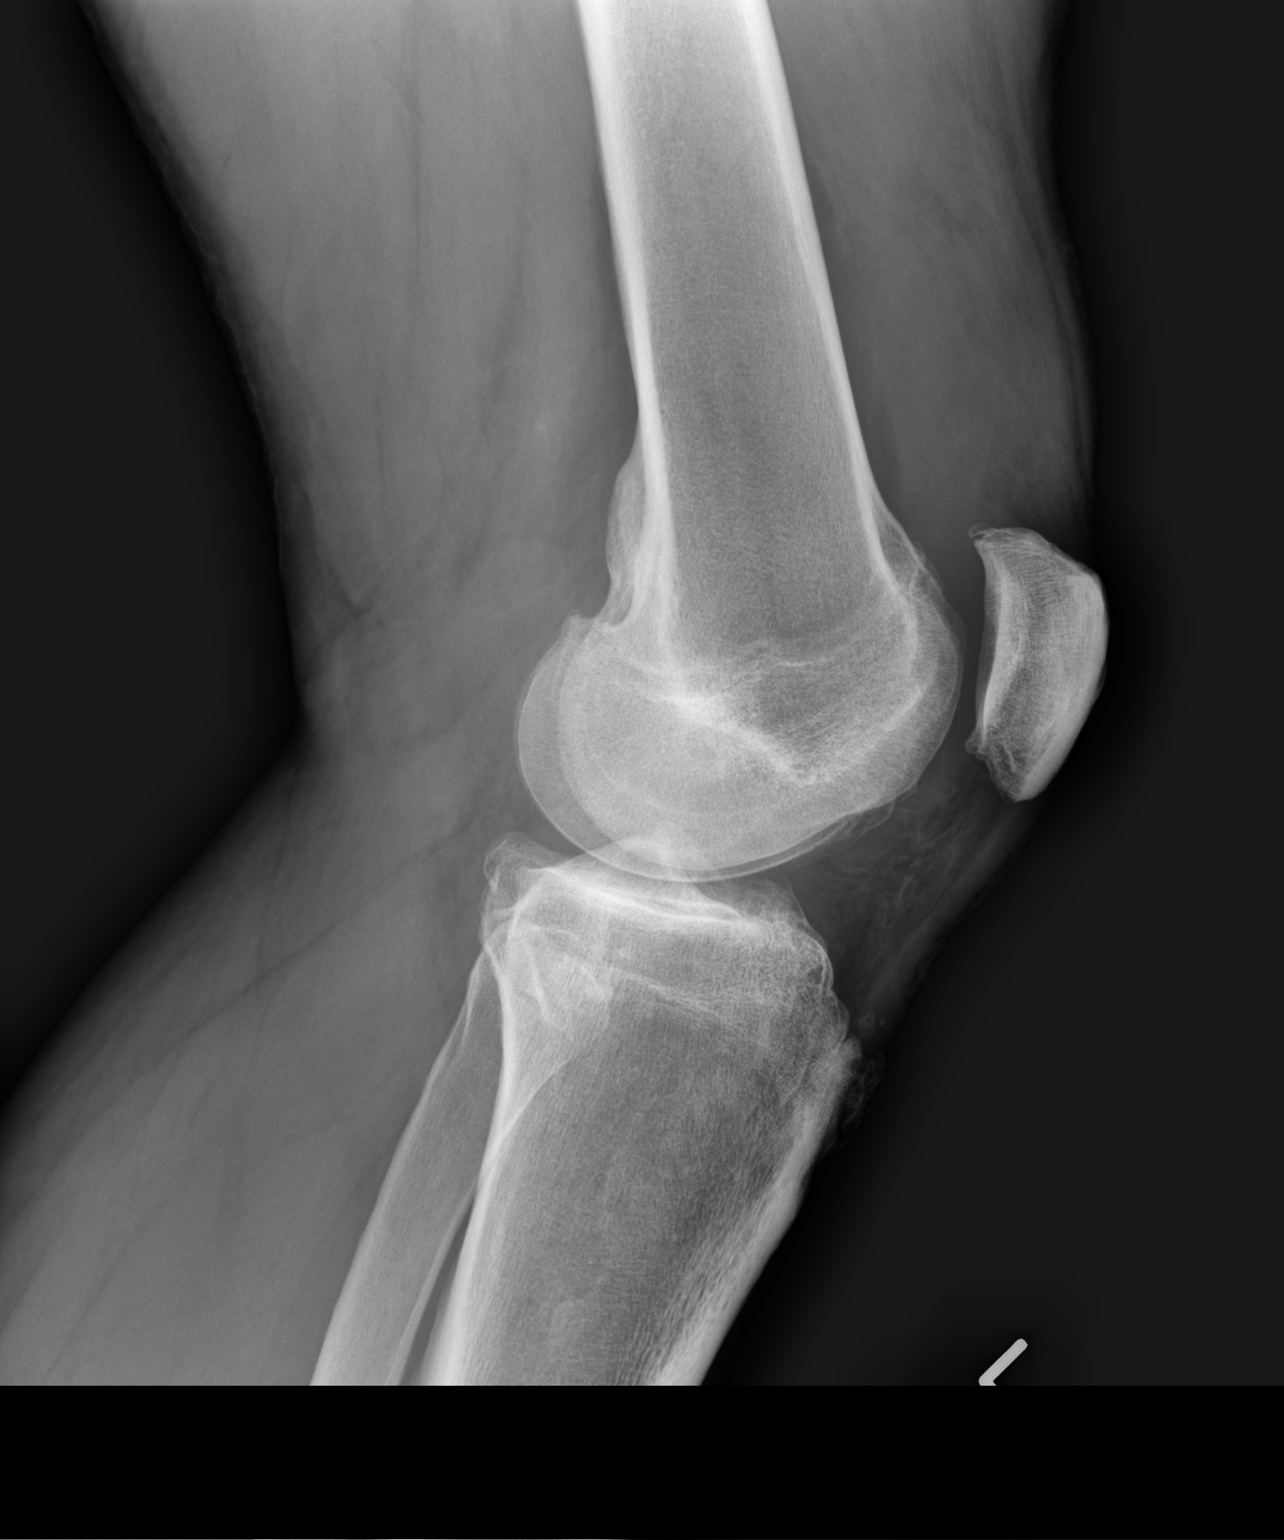

[3 of 3 positions shown; findings below may reference images not displayed]

FINDINGS: Subtle medial subluxation of the distal femur relative to the tibial
plateau. No acute-appearing osseous abnormality. No osteophytes or
other signs of significant degenerative osteoarthritis. Probable
joint effusion within the suprapatellar bursa. Superficial soft
tissues about the LEFT knee are unremarkable.
IMPRESSION: 1. Subtle medial subluxation of the distal femur relative to the
tibial plateau, suggesting ligamentous laxity.
2. Probable joint effusion.
3. No acute-appearing osseous abnormality.
4. No evidence of degenerative osteoarthritis.

## 2022-03-23 IMAGING — CT CT ABD-PELV W/ CM
2 of 5 series · 16 of 46 positions shown, 18 images · IV contrast (iopamidol)
Comparison: 07/09/2018

CLINICAL DATA: Bilateral flank pain, microhematuria, unintentional
weight loss

EXAM:
CT ABDOMEN AND PELVIS WITH CONTRAST
TECHNIQUE: Multidetector CT imaging of the abdomen and pelvis was performed
using the standard protocol following bolus administration of
intravenous contrast.
CONTRAST:  100mL 1IMCEL-555 IOPAMIDOL (1IMCEL-555) INJECTION 61%

[Series 2: abd pelvis 5.00 br40 s3 axial · axial · 0.71mm/px · z∈[+1255,+1640]mm · 13 of 87 slices shown, 15 images]
[im 5/87  soft-tissue]
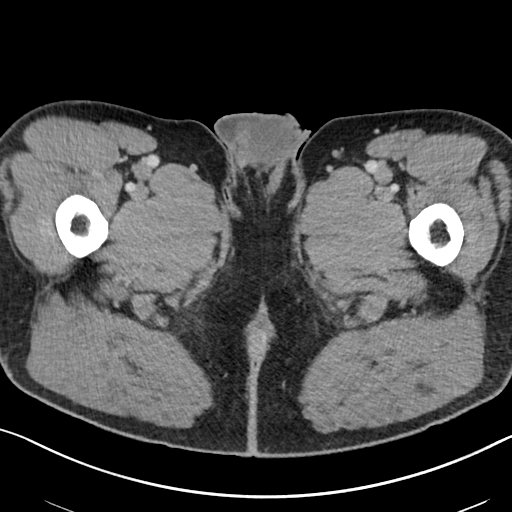
[im 5/87  bone]
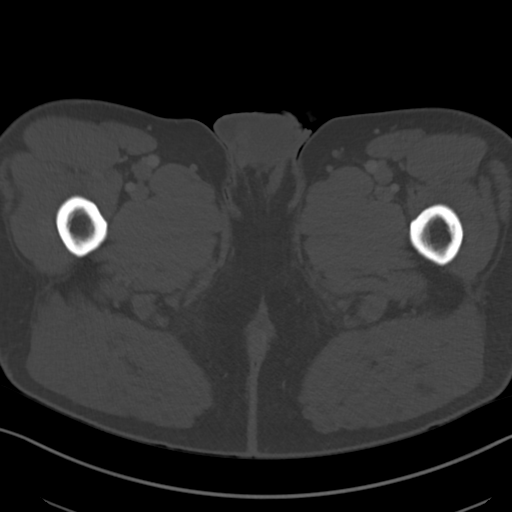
[im 14/87  soft-tissue]
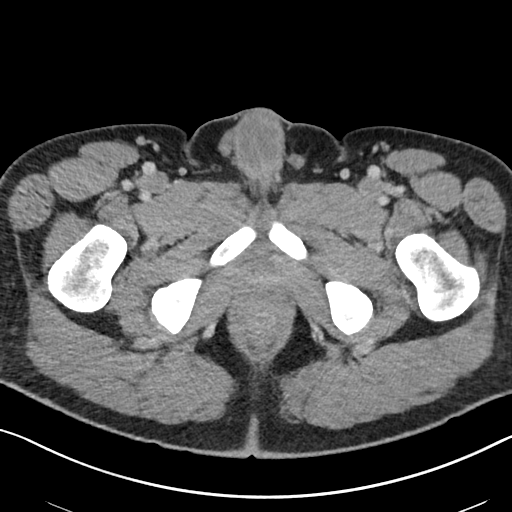
[im 19/87  soft-tissue]
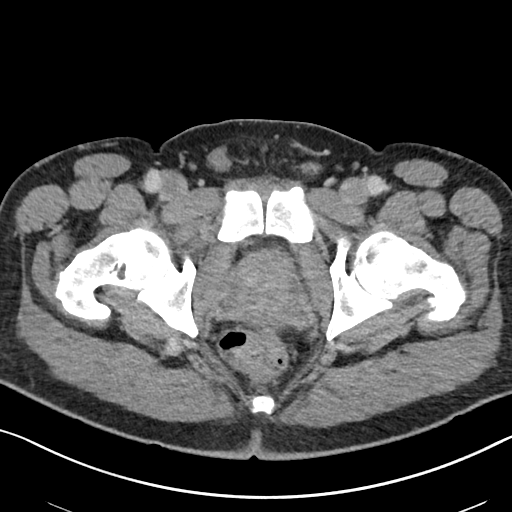
[im 23/87  soft-tissue]
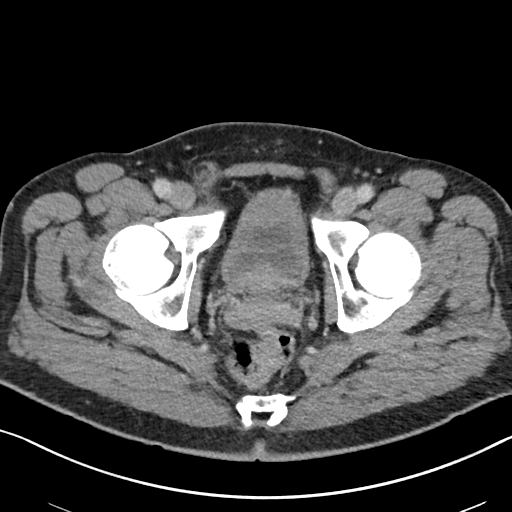
[im 32/87  soft-tissue]
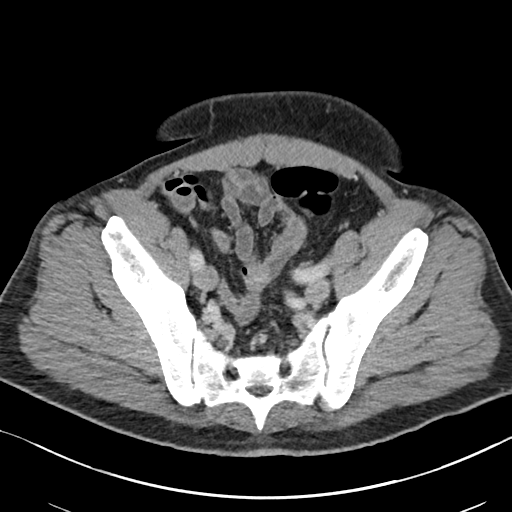
[im 37/87  soft-tissue]
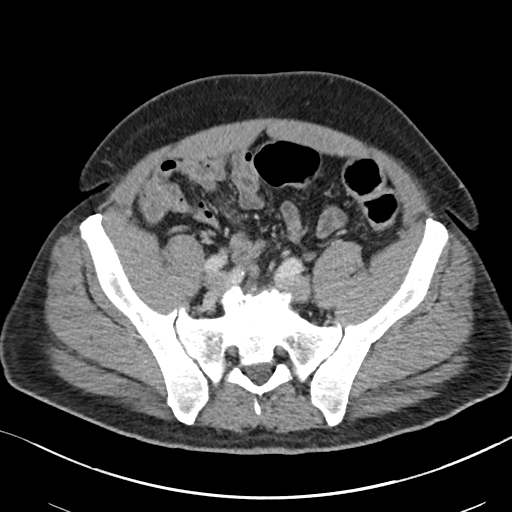
[im 46/87  soft-tissue]
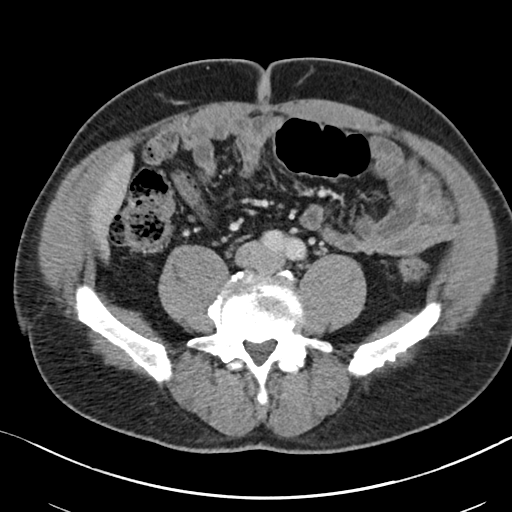
[im 50/87  soft-tissue]
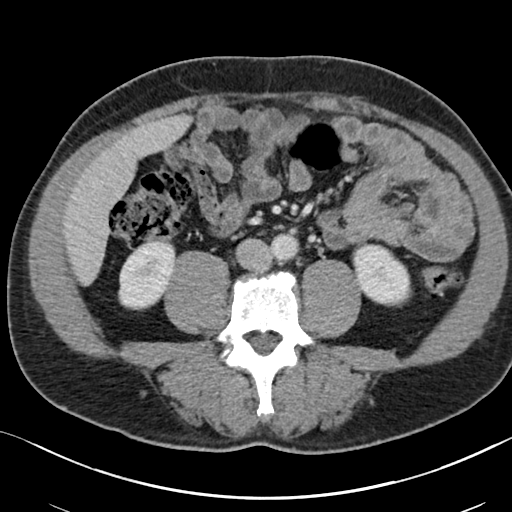
[im 55/87  soft-tissue]
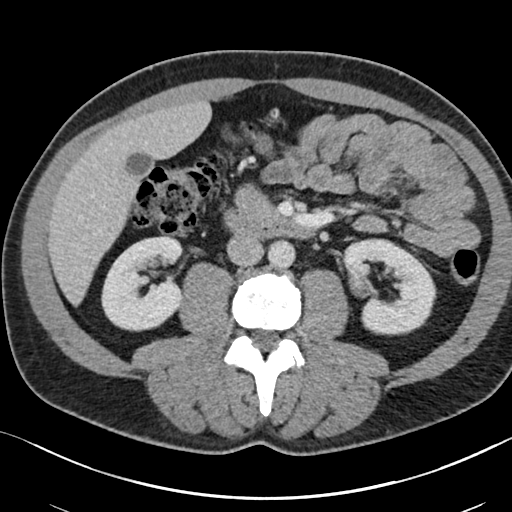
[im 55/87  bone]
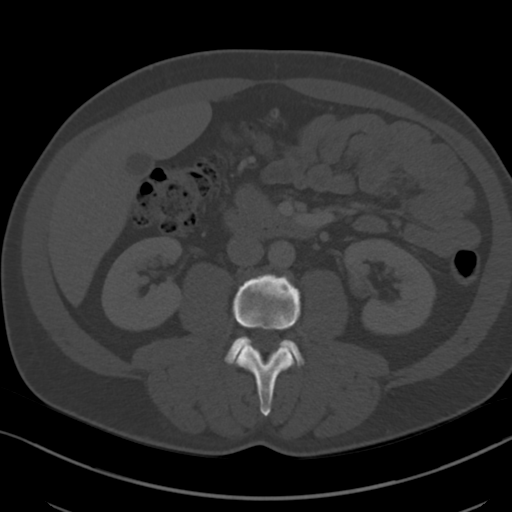
[im 64/87  soft-tissue]
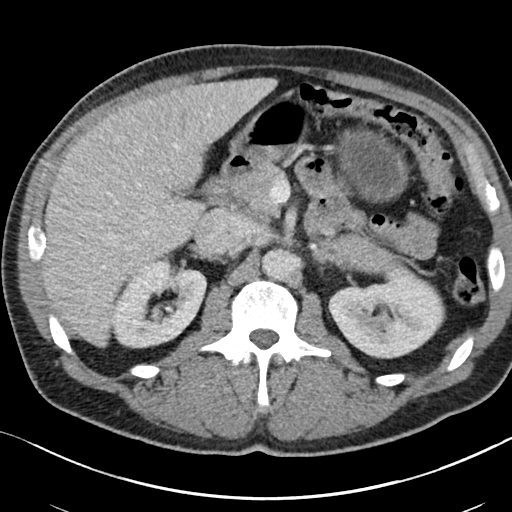
[im 68/87  soft-tissue]
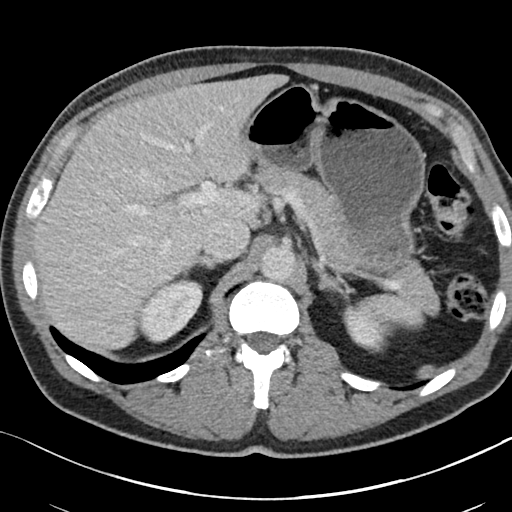
[im 73/87  soft-tissue]
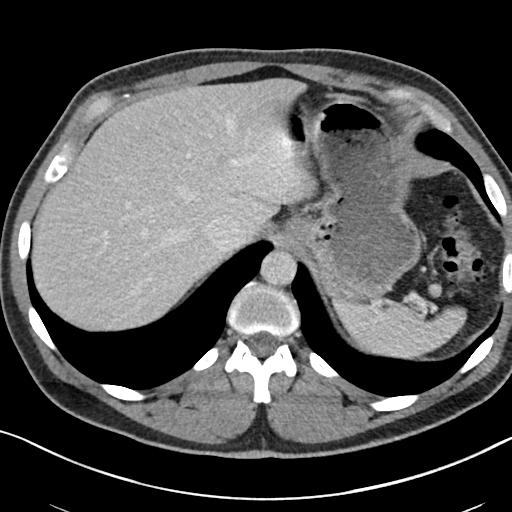
[im 82/87  soft-tissue]
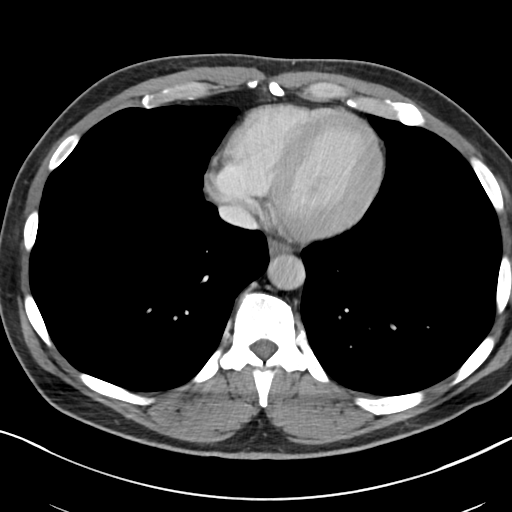

[Series 6: abd pelvis 2.00 br40 s3 cor · coronal · 0.71mm/px · 3 of 180 slices shown]
[im 60/180  soft-tissue]
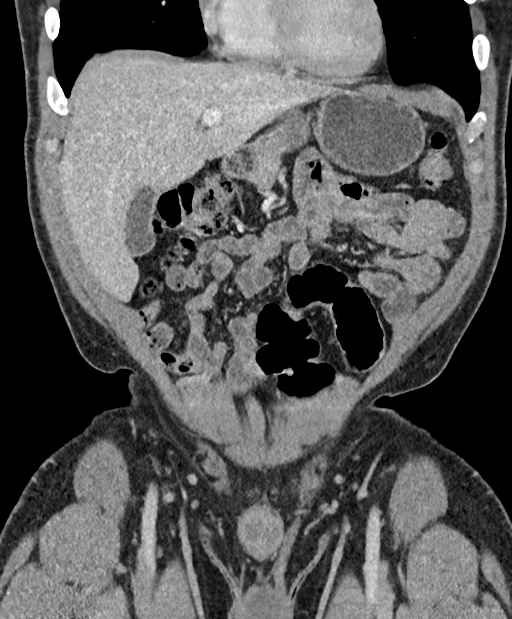
[im 80/180  soft-tissue]
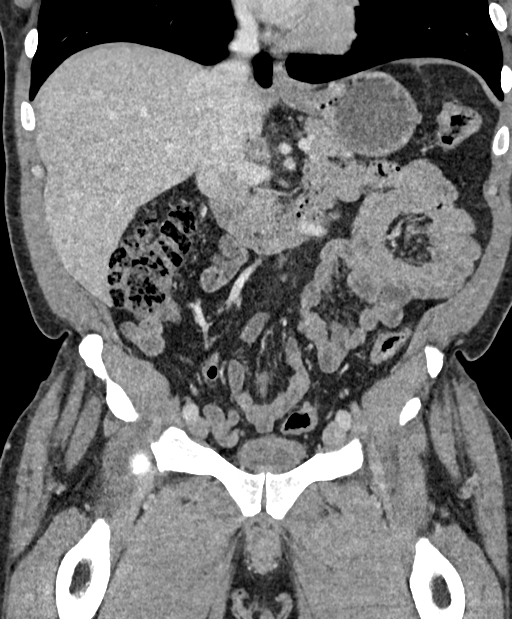
[im 100/180  soft-tissue]
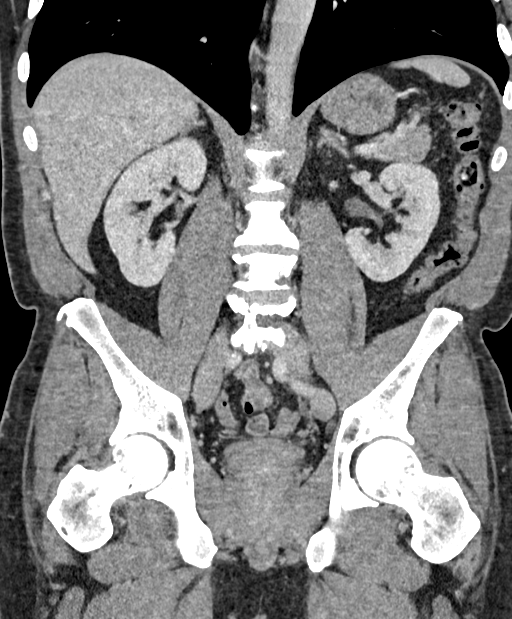

[16 of 46 positions shown; findings below may reference images not displayed]

FINDINGS: Lower chest: No acute abnormality.

Hepatobiliary: No solid liver abnormality is seen. No gallstones,
gallbladder wall thickening, or biliary dilatation.

Pancreas: Unremarkable. No pancreatic ductal dilatation or
surrounding inflammatory changes.

Spleen: Normal in size without significant abnormality.

Adrenals/Urinary Tract: Adrenal glands are unremarkable. Kidneys are
normal, without renal calculi, solid lesion, or hydronephrosis. Mild
wall thickening of the decompressed urinary bladder.

Stomach/Bowel: Stomach is within normal limits. Appendix appears
normal. No evidence of bowel wall thickening, distention, or
inflammatory changes.

Vascular/Lymphatic: Aortic atherosclerosis. No enlarged abdominal or
pelvic lymph nodes.

Reproductive: Mild prostatomegaly.

Other: No abdominal wall hernia or abnormality. No abdominopelvic
ascites.

Musculoskeletal: No acute or significant osseous findings.
IMPRESSION: 1. No CT findings of the abdomen or pelvis to explain bilateral
flank pain, microhematuria, or weight loss. No evidence of urinary
tract calculus or hydronephrosis.
2. Mild prostatomegaly. Mild wall thickening of the decompressed
urinary bladder, which likely due to chronic outlet obstruction.
Correlate with urinalysis.

Aortic Atherosclerosis (35D75-3NM.M).

## 2023-01-01 ENCOUNTER — Emergency Department (HOSPITAL_COMMUNITY): Payer: BC Managed Care – PPO

## 2023-01-01 ENCOUNTER — Encounter (HOSPITAL_COMMUNITY): Payer: Self-pay | Admitting: Emergency Medicine

## 2023-01-01 ENCOUNTER — Other Ambulatory Visit: Payer: Self-pay

## 2023-01-01 ENCOUNTER — Emergency Department (HOSPITAL_COMMUNITY)
Admission: EM | Admit: 2023-01-01 | Discharge: 2023-01-01 | Disposition: A | Discharge status: Home / Self Care | Payer: BC Managed Care – PPO | Attending: Emergency Medicine | Admitting: Emergency Medicine

## 2023-01-01 DIAGNOSIS — I10 Essential (primary) hypertension: Secondary | ICD-10-CM | POA: Diagnosis not present

## 2023-01-01 DIAGNOSIS — R079 Chest pain, unspecified: Secondary | ICD-10-CM | POA: Insufficient documentation

## 2023-01-01 DIAGNOSIS — Z79899 Other long term (current) drug therapy: Secondary | ICD-10-CM | POA: Insufficient documentation

## 2023-01-01 DIAGNOSIS — R002 Palpitations: Secondary | ICD-10-CM | POA: Diagnosis present

## 2023-01-01 LAB — CBC
HCT: 43.4 % (ref 39.0–52.0)
Hemoglobin: 14.7 g/dL (ref 13.0–17.0)
MCH: 29.1 pg (ref 26.0–34.0)
MCHC: 33.9 g/dL (ref 30.0–36.0)
MCV: 85.9 fL (ref 80.0–100.0)
Platelets: 280 K/uL (ref 150–400)
RBC: 5.05 MIL/uL (ref 4.22–5.81)
RDW: 13.7 % (ref 11.5–15.5)
WBC: 5.5 K/uL (ref 4.0–10.5)
nRBC: 0 % (ref 0.0–0.2)

## 2023-01-01 LAB — BASIC METABOLIC PANEL
Anion gap: 9 (ref 5–15)
BUN: 11 mg/dL (ref 6–20)
CO2: 23 mmol/L (ref 22–32)
Calcium: 9.5 mg/dL (ref 8.9–10.3)
Chloride: 105 mmol/L (ref 98–111)
Creatinine, Ser: 0.84 mg/dL (ref 0.61–1.24)
GFR, Estimated: >60 mL/min (ref >60)
Glucose, Bld: 102 mg/dL — ABNORMAL HIGH (ref 70–99)
Potassium: 3.7 mmol/L (ref 3.5–5.1)
Sodium: 137 mmol/L (ref 135–145)

## 2023-01-01 LAB — TROPONIN I (HIGH SENSITIVITY)
Troponin I (High Sensitivity): 3 ng/L (ref <18)
Troponin I (High Sensitivity): 4 ng/L (ref <18)

## 2023-01-01 LAB — BRAIN NATRIURETIC PEPTIDE: B Natriuretic Peptide: 8.5 pg/mL (ref 0.0–100.0)

## 2023-01-01 LAB — D-DIMER, QUANTITATIVE: D-Dimer, Quant: <0.27 ug/mL-FEU (ref 0.00–0.50)

## 2023-01-01 NOTE — ED Triage Notes (Signed)
Pt came in due to chest pain that started this past Saturday 12/28/22.  Pt reports palpitations as well.  Pt does report radiation to back.

## 2023-01-01 NOTE — ED Provider Notes (Signed)
[place] EMERGENCY DEPARTMENT AT [facility] Provider Note   CSN: [id number] Arrival date & time: 01/01/23  1003     History  Chief Complaint  Patient presents with   Chest Pain    Kevin [name] is a 57 y.o. male history of hypertension presented with palpitations and irregular heartbeat for the past 4 days.  Patient stated he went to see a cardiologist on Monday and his EKG was reassuring.  Patient denies any shortness of breath with his chest discomfort but notes that his left arm today is starting to feel numb.  Patient states he feels a poking sensation on the left side of his chest that comes and goes without obvious triggers.  Patient denied any weakness however.  Patient denies any blood thinners, previous blood clots, recent travel, leg swelling, recent hospitalization/surgery.  Patient does note that recently he has become dyspneic on exertion when going for walks with his wife which is new.  Patient denied fevers, recent illness, neck pain, headache, vision changes, abdominal pain, nausea/vomiting, shortness of breath  Triage note states that chest pain radiates to the back however when I spoke to the patient he denied this.  Home Medications Prior to Admission medications   Medication Sig Start Date End Date Taking? Authorizing Provider  Fluticasone Propionate (XHANCE) 93 MCG/ACT EXHU Spray 1-2 sprays per nostril daily 04/10/21   Alfonse Spruce, MD  lisinopril-hydrochlorothiazide (ZESTORETIC) 10-12.5 MG tablet Take 1 tablet by mouth daily. 11/04/20   [provider]  tamsulosin (FLOMAX) 0.4 MG CAPS capsule Take 0.4 mg by mouth daily.    [provider]      Allergies    Patient has no known allergies.    Review of Systems   Review of Systems  Cardiovascular:  Positive for chest pain.    Physical Exam Updated Vital Signs BP (!) 130/92   Pulse 71   Temp 98.8 F (37.1 C) (Oral)   Resp 13   Ht 5\' 11"  (1.803 m)   Wt 106.6 kg    SpO2 98%   BMI 32.78 kg/m  Physical Exam Vitals reviewed.  Constitutional:      General: He is not in acute distress. HENT:     Head: Normocephalic and atraumatic.  Eyes:     Extraocular Movements: Extraocular movements intact.     Conjunctiva/sclera: Conjunctivae normal.     Pupils: Pupils are equal, round, and reactive to light.  Cardiovascular:     Rate and Rhythm: Normal rate and regular rhythm.     Pulses: Normal pulses.     Heart sounds: Normal heart sounds.     Comments: 2+ bilateral radial/dorsalis pedis pulses with regular rate Pulmonary:     Effort: Pulmonary effort is normal. No respiratory distress.     Breath sounds: Normal breath sounds.  Abdominal:     Palpations: Abdomen is soft.     Tenderness: There is no abdominal tenderness. There is no guarding or rebound.  Musculoskeletal:        General: Normal range of motion.     Cervical back: Normal range of motion and neck supple.     Right lower leg: No edema.     Left lower leg: No edema.     Comments: 5 out of 5 bilateral grip/leg extension strength  Skin:    General: Skin is warm and dry.     Capillary Refill: Capillary refill takes less than 2 seconds.  Neurological:     General:  No focal deficit present.     Mental Status: He is alert and oriented to person, place, and time.     Comments: Sensation intact in all 4 limbs  Psychiatric:        Mood and Affect: Mood normal.     ED Results / Procedures / Treatments   Labs (all labs ordered are listed, but only abnormal results are displayed) Labs Reviewed  BASIC METABOLIC PANEL - Abnormal; Notable for the following components:      Result Value   Glucose, Bld 102 (*)    All other components within normal limits  CBC  D-DIMER, QUANTITATIVE  BRAIN NATRIURETIC PEPTIDE  TROPONIN I (HIGH SENSITIVITY)  TROPONIN I (HIGH SENSITIVITY)    EKG EKG Interpretation Date/Time:  Wednesday January 01 2023 10:11:46 EDT Ventricular Rate:  81 PR Interval:  175 QRS  Duration:  95 QT Interval:  386 QTC Calculation: 448 R Axis:   -62  Text Interpretation: Sinus rhythm Left anterior fascicular block Abnormal R-wave progression, late transition Confirmed by Vonita Moss [phone]) on 01/01/2023 10:46:21 AM  Radiology DG Chest Port 1 View  Result Date: 01/01/2023 CLINICAL DATA:  Chest pain prominent dyspnea on exertion EXAM: PORTABLE CHEST 1 VIEW COMPARISON:  Chest radiograph dated February 18, 2021 FINDINGS: The heart size and mediastinal contours are within normal limits. Both lungs are clear. The visualized skeletal structures are unremarkable. IMPRESSION: No active disease. Electronically Signed   By: Larose Hires D.O.   On: 01/01/2023 10:43    Procedures Procedures    Medications Ordered in ED Medications - No data to display  ED Course/ Medical Decision Making/ A&P                             Medical Decision Making Amount and/or Complexity of Data Reviewed Labs: ordered. Radiology: ordered.   Kevin [name] 57 y.o. presented today for chest pain. Working DDx that I considered at this time includes, but not limited to, ACS, GERD, pulmonary embolism, community-acquired pneumonia, aortic dissection, pneumothorax, underlying bony abnormality, anemia, thyrotoxicosis, esophageal rupture.    R/o Dx: ACS, GERD, pulmonary embolism, community-acquired pneumonia, aortic dissection, pneumothorax, underlying bony abnormality, anemia, thyrotoxicosis, esophageal rupture: These are considered less likely due to history of present illness and physical exam findings.  PE: D-dimer negative Aortic Dissection: less likely based on the location, quality, onset, and severity of symptoms in this case. Patient also has a lack of underlying history of AD or TAA.   Review of prior external notes: 12/30/2022 office visit  Unique Tests and My Interpretation:  EKG: Rate, rhythm, axis, intervals all examined and without medically relevant abnormality. ST segments  without concerns for elevations Troponin: 3, 4 CXR: No acute cardiopulmonary changes CBC: Unremarkable BMP: unremarkable BNP: Unremarkable D-dimer: Negative  Discussion with Independent Historian: None  Discussion of Management of Tests: None  Risk: Low: based on diagnostic testing/clinical impression and treatment plan  Risk Stratification Score: None  Plan: On exam patient was in no acute distress with stable vitals. Patient's physical was unremarkable. Labs and CXR will be ordered.  A D-dimer and BNP were added on this patient was endorsing dyspnea upon exertion.  Upon chart review it appears patient went to his primary care provider on 12/30/2022 and was ultimately sent home after reassuring workup.  Patient is currently on lisinopril/hydrochlorothiazide for blood pressure but no other cardiovascular medications.  Patient stable at this time.  On recheck patient  was asymptomatic.  His labs at this time are reassuring and pending delta troponin patient may be discharged with cardiology follow-up.  Patient stable this time.  Patient's delta troponin and BNP are reassuring.  Patient be given cardiology follow-up as he has had multiple episodes of chest pain with ER visits stemming from last year for long-term management.  Patient was given return precautions. Patient stable for discharge at this time.  Patient verbalized understanding of plan.         Final Clinical Impression(s) / ED Diagnoses Final diagnoses:  Chest pain, unspecified type    Rx / DC Orders ED Discharge Orders          Ordered    Ambulatory referral to Cardiology       Comments: If you have not heard from the Cardiology office within the next 72 hours please call [phone].   01/01/23 1328              Remi Deter 01/01/23 1330    Rondel Baton, MD 01/02/23 [phone]

## 2023-01-01 NOTE — Discharge Instructions (Signed)
Cardiology will call you in the next 72 hours to schedule appointment in regards to your recent ER visit.  Today your labs and imaging are reassuring.  Please monitor your symptoms and if symptoms change or worsen please return to ER.

## 2023-01-01 NOTE — Progress Notes (Signed)
Visited with patient to provide support. Pt came in with chest pain.  Pt is alert and feeling better. Pt. Said he was waiting on result from test.  Chaplain provided emotional and spiritual support.  Chaplain available as needed.  Venida Jarvis, [place], [facility], Pager [phone]

## 2023-01-14 DIAGNOSIS — E785 Hyperlipidemia, unspecified: Secondary | ICD-10-CM | POA: Insufficient documentation

## 2023-01-14 DIAGNOSIS — N400 Enlarged prostate without lower urinary tract symptoms: Secondary | ICD-10-CM | POA: Insufficient documentation

## 2023-01-14 DIAGNOSIS — I10 Essential (primary) hypertension: Secondary | ICD-10-CM | POA: Insufficient documentation

## 2023-01-24 ENCOUNTER — Encounter: Payer: Self-pay | Admitting: Cardiovascular Disease

## 2023-01-24 ENCOUNTER — Ambulatory Visit (INDEPENDENT_AMBULATORY_CARE_PROVIDER_SITE_OTHER): Payer: BC Managed Care – PPO

## 2023-01-24 ENCOUNTER — Ambulatory Visit: Payer: BC Managed Care – PPO | Attending: Cardiovascular Disease | Admitting: Cardiovascular Disease

## 2023-01-24 VITALS — BP 112/80 | HR 69 | Ht 71.0 in | Wt 236.0 lb

## 2023-01-24 DIAGNOSIS — I10 Essential (primary) hypertension: Secondary | ICD-10-CM | POA: Diagnosis not present

## 2023-01-24 DIAGNOSIS — R002 Palpitations: Secondary | ICD-10-CM

## 2023-01-24 NOTE — Patient Instructions (Signed)
Medication Instructions:  No changes *If you need a refill on your cardiac medications before your next appointment, please call your pharmacy*   Lab Work: None ordered If you have labs (blood work) drawn today and your tests are completely normal, you will receive your results only by: MyChart Message (if you have MyChart) OR A paper copy in the mail If you have any lab test that is abnormal or we need to change your treatment, we will call you to review the results.   Testing/Procedures: None ordered   Follow-Up: At [facility], you and your health needs are our priority.  As part of our continuing mission to provide you with exceptional heart care, we have created designated Provider Care Teams.  These Care Teams include your primary Cardiologist (physician) and Advanced Practice Providers (APPs -  Physician Assistants and Nurse Practitioners) who all work together to provide you with the care you need, when you need it.  We recommend signing up for the patient portal called "MyChart".  Sign up information is provided on this After Visit Summary.  MyChart is used to connect with patients for Virtual Visits (Telemedicine).  Patients are able to view lab/test results, encounter notes, upcoming appointments, etc.  Non-urgent messages can be sent to your provider as well.   To learn more about what you can do with MyChart, go to [email or web address].    Your next appointment:   Follow up as needed with Dr. Arida Other Instructions ZIO XT- Long Term Monitor Instructions  Your physician has requested you wear a ZIO patch monitor for 14 days.  This is a single patch monitor. [employer] supplies one patch monitor per enrollment. Additional stickers are not available. Please do not apply patch if you will be having a Nuclear Stress Test,  Echocardiogram, Cardiac CT, MRI, or Chest Xray during the period you would be wearing the  monitor. The patch cannot be worn during these tests.  You cannot remove and re-apply the  ZIO XT patch monitor.  Your ZIO patch monitor will be mailed 3 day USPS to your address on file. It may take 3-5 days  to receive your monitor after you have been enrolled.  Once you have received your monitor, please review the enclosed instructions. Your monitor  has already been registered assigning a specific monitor serial # to you.  Billing and Patient Assistance Program Information  We have supplied [employer] with any of your insurance information on file for billing purposes. [employer] offers a sliding scale Patient Assistance Program for patients that do not have  insurance, or whose insurance does not completely cover the cost of the ZIO monitor.  You must apply for the Patient Assistance Program to qualify for this discounted rate.  To apply, please call [employer] at [phone], select option 4, select option 2, ask to apply for  Patient Assistance Program. [employer] will ask your household income, and how many people  are in your household. They will quote your out-of-pocket cost based on that information.  [employer] will also be able to set up a 12-month, interest-free payment plan if needed.  Applying the monitor   Shave hair from upper left chest.  Hold abrader disc by orange tab. Rub abrader in 40 strokes over the upper left chest as  indicated in your monitor instructions.  Clean area with 4 enclosed alcohol pads. Let dry.  Apply patch as indicated in monitor instructions. Patch will be placed under collarbone on left  side of chest with   arrow pointing upward.  Rub patch adhesive wings for 2 minutes. Remove white label marked "1". Remove the white  label marked "2". Rub patch adhesive wings for 2 additional minutes.  While looking in a mirror, press and release button in center of patch. A small green light will  flash 3-4 times. This will be your only indicator that the monitor has been turned on.  Do not shower for the first 24 hours. You  may shower after the first 24 hours.  Press the button if you feel a symptom. You will hear a small click. Record Date, Time and  Symptom in the Patient Logbook.  When you are ready to remove the patch, follow instructions on the last 2 pages of Patient  Logbook. Stick patch monitor onto the last page of Patient Logbook.  Place Patient Logbook in the blue and white box. Use locking tab on box and tape box closed  securely. The blue and white box has prepaid postage on it. Please place it in the mailbox as  soon as possible. Your physician should have your test results approximately 7 days after the  monitor has been mailed back to [employer].  Call [employer] Customer Care at [phone] if you have questions regarding  your ZIO XT patch monitor. Call them immediately if you see an orange light blinking on your  monitor.  If your monitor falls off in less than 4 days, contact our Monitor department at [phone].  If your monitor becomes loose or falls off after 4 days call [employer] at [phone] for  suggestions on securing your monitor   

## 2023-01-24 NOTE — Addendum Note (Signed)
Addended by: Kendrick Fries on: 01/24/2023 10:12 AM   Modules accepted: Orders

## 2023-01-24 NOTE — Progress Notes (Signed)
Cardiology Office Note   Date:  01/24/2023   ID:  Kevin [name], DOB Jul 04, 1965, MRN [id number]  PCP:  Georgann Housekeeper, MD  Cardiologist:   Lorine Bears, MD   Chief Complaint  Patient presents with   New Patient (Initial Visit)    Chest pain pt denies chest pain c/o irregular heart rhythm. Meds reviewed verbally with pt.      History of Present Illness: Kevin [name] is a 57 y.o. male who was referred by [facility], ED for evaluation of palpitations.  He reports having cardiac catheterization done more than 8 years ago that showed no significant coronary artery disease.  I could not find these results.  He has known history of essential hypertension, obesity and sleep apnea that resolved with septostomy. He reports intermittent palpitations described as skipping in his heart that takes his breath away.  He denies chest pain.  He has no exertional symptoms of chest pain or shortness of breath.  Most of the episodes of palpitations happen at rest and last few seconds but sometimes can last longer.  No associated dizziness, syncope or presyncope.  He went to [facility], ER on July 3 with the symptoms.  His labs were unremarkable and overall including normal troponin, BNP and D-dimer.  Chest x-ray was within normal limits.  EKG showed sinus rhythm with left anterior fascicular block.  He does not smoke and no excessive use of alcohol.  He does not drink caffeine but drinks soft drinks occasionally.  He has no family history of premature coronary artery disease or sudden death. He is a Curator and very active at work.  Past Medical History:  Diagnosis Date   Hypertension    Sleep apnea     Past Surgical History:  Procedure Laterality Date   NASAL SEPTOPLASTY W/ TURBINOPLASTY Bilateral 05/23/2021   Procedure: NASAL SEPTOPLASTY WITH BILATERAL INFERIOR TURBINATE REDUCTION;  Surgeon: Osborn Coho, MD;  Location: [facility];  Service: ENT;  Laterality:  Bilateral;   NO PAST SURGERIES       Current Outpatient Medications  Medication Sig Dispense Refill   lisinopril-hydrochlorothiazide (ZESTORETIC) 10-12.5 MG tablet Take 1 tablet by mouth daily.     tamsulosin (FLOMAX) 0.4 MG CAPS capsule Take 0.4 mg by mouth daily.     No current facility-administered medications for this visit.    Allergies:   Patient has no known allergies.    Social History:  The patient  reports that he has never smoked. He has never used smokeless tobacco. He reports that he does not currently use alcohol. He reports that he does not use drugs.   Family History:  The patient's family history includes Arrhythmia in his father.    ROS:  Please see the history of present illness.   Otherwise, review of systems are positive for none.   All other systems are reviewed and negative.    PHYSICAL EXAM: VS:  BP 112/80 (BP Location: Right Arm, Patient Position: Sitting, Cuff Size: Large)   Pulse 69   Ht 5\' 11"  (1.803 m)   Wt 236 lb (107 kg)   SpO2 98%   BMI 32.92 kg/m  , BMI Body mass index is 32.92 kg/m. GEN: Well nourished, well developed, in no acute distress  HEENT: normal  Neck: no JVD, carotid bruits, or masses Cardiac: RRR; no murmurs, rubs, or gallops,no edema  Respiratory:  clear to auscultation bilaterally, normal work of breathing GI: soft, nontender, nondistended, + BS  MS: no deformity or atrophy  Skin: warm and dry, no rash Neuro:  Strength and sensation are intact Psych: euthymic mood, full affect   EKG:  EKG is ordered today. The ekg ordered today demonstrates : Normal sinus rhythm Left anterior fascicular block When compared with ECG of 01-Jan-2023 10:11, No significant change was found    Recent Labs: 01/01/2023: B Natriuretic Peptide 8.5; BUN 11; Creatinine, Ser 0.84; Hemoglobin 14.7; Platelets 280; Potassium 3.7; Sodium 137    Lipid Panel No results found for: "CHOL", "TRIG", "HDL", "CHOLHDL", "VLDL", "LDLCALC", "LDLDIRECT"    Wt  Readings from Last 3 Encounters:  01/24/23 236 lb (107 kg)  01/01/23 235 lb (106.6 kg)  05/23/21 239 lb 3.2 oz (108.5 kg)         01/24/2023    8:38 AM  PAD Screen  Previous PAD dx? No  Previous surgical procedure? No  Pain with walking? No  Feet/toe relief with dangling? No  Painful, non-healing ulcers? No  Extremities discolored? No      ASSESSMENT AND PLAN:  1.  Palpitations: Likely due to PVCs based on his description but we have to exclude other associated arrhythmia.  I requested a 2 weeks ZIO monitor.  The patient has no symptoms of angina or heart failure and thus I did not order any other testing at this time.  2.  Essential hypertension: Blood pressure is controlled on current medications.    Disposition:   FU as needed if monitor is abnormal.  Signed,  Lorine Bears, MD  01/24/2023 8:53 AM    [facility]

## 2023-01-28 DIAGNOSIS — R002 Palpitations: Secondary | ICD-10-CM
